# Patient Record
Sex: Female | Born: 1960 | Race: White | Hispanic: No | Marital: Single | Smoking: Current every day smoker
Health system: Southern US, Community
[De-identification: ages and names within clinical notes are randomized; demographics above are authoritative.]

## PROBLEM LIST (undated history)

## (undated) DIAGNOSIS — IMO0002 Reserved for concepts with insufficient information to code with codable children: Secondary | ICD-10-CM

## (undated) DIAGNOSIS — M1990 Unspecified osteoarthritis, unspecified site: Secondary | ICD-10-CM

## (undated) DIAGNOSIS — S3992XA Unspecified injury of lower back, initial encounter: Secondary | ICD-10-CM

## (undated) HISTORY — PX: GASTRIC BYPASS: SHX52

---

## 2013-04-04 ENCOUNTER — Emergency Department (HOSPITAL_COMMUNITY): Payer: Self-pay

## 2013-04-04 ENCOUNTER — Encounter (HOSPITAL_COMMUNITY): Payer: Self-pay | Admitting: Emergency Medicine

## 2013-04-04 ENCOUNTER — Emergency Department (HOSPITAL_COMMUNITY)
Admission: EM | Admit: 2013-04-04 | Discharge: 2013-04-04 | Disposition: A | Discharge status: Home / Self Care | Payer: Self-pay | Attending: Emergency Medicine | Admitting: Emergency Medicine

## 2013-04-04 DIAGNOSIS — R112 Nausea with vomiting, unspecified: Secondary | ICD-10-CM | POA: Insufficient documentation

## 2013-04-04 DIAGNOSIS — R109 Unspecified abdominal pain: Secondary | ICD-10-CM | POA: Insufficient documentation

## 2013-04-04 DIAGNOSIS — R197 Diarrhea, unspecified: Secondary | ICD-10-CM | POA: Insufficient documentation

## 2013-04-04 DIAGNOSIS — F17200 Nicotine dependence, unspecified, uncomplicated: Secondary | ICD-10-CM | POA: Insufficient documentation

## 2013-04-04 DIAGNOSIS — Z79899 Other long term (current) drug therapy: Secondary | ICD-10-CM | POA: Insufficient documentation

## 2013-04-04 DIAGNOSIS — Z8739 Personal history of other diseases of the musculoskeletal system and connective tissue: Secondary | ICD-10-CM | POA: Insufficient documentation

## 2013-04-04 HISTORY — DX: Reserved for concepts with insufficient information to code with codable children: IMO0002

## 2013-04-04 HISTORY — DX: Unspecified injury of lower back, initial encounter: S39.92XA

## 2013-04-04 HISTORY — DX: Unspecified osteoarthritis, unspecified site: M19.90

## 2013-04-04 LAB — URINALYSIS, ROUTINE W REFLEX MICROSCOPIC
BILIRUBIN URINE: NEGATIVE
GLUCOSE, UA: NEGATIVE mg/dL
Hgb urine dipstick: NEGATIVE
Ketones, ur: NEGATIVE mg/dL
Leukocytes, UA: NEGATIVE
Nitrite: NEGATIVE
Protein, ur: NEGATIVE mg/dL
SPECIFIC GRAVITY, URINE: 1.02 (ref 1.005–1.030)
Urobilinogen, UA: 0.2 mg/dL (ref 0.0–1.0)
pH: 7.5 (ref 5.0–8.0)

## 2013-04-04 LAB — CBC WITH DIFFERENTIAL/PLATELET
BASOS PCT: 2 % — AB (ref 0–1)
Basophils Absolute: 0.1 10*3/uL (ref 0.0–0.1)
EOS PCT: 4 % (ref 0–5)
Eosinophils Absolute: 0.2 10*3/uL (ref 0.0–0.7)
HCT: 33.5 % — ABNORMAL LOW (ref 36.0–46.0)
HEMOGLOBIN: 10.2 g/dL — AB (ref 12.0–15.0)
LYMPHS PCT: 23 % (ref 12–46)
Lymphs Abs: 1.3 10*3/uL (ref 0.7–4.0)
MCH: 22.8 pg — AB (ref 26.0–34.0)
MCHC: 30.4 g/dL (ref 30.0–36.0)
MCV: 74.9 fL — ABNORMAL LOW (ref 78.0–100.0)
Monocytes Absolute: 0.5 10*3/uL (ref 0.1–1.0)
Monocytes Relative: 8 % (ref 3–12)
NEUTROS PCT: 63 % (ref 43–77)
Neutro Abs: 3.7 10*3/uL (ref 1.7–7.7)
Platelets: 343 10*3/uL (ref 150–400)
RBC: 4.47 MIL/uL (ref 3.87–5.11)
RDW: 19.1 % — ABNORMAL HIGH (ref 11.5–15.5)
WBC: 5.8 10*3/uL (ref 4.0–10.5)

## 2013-04-04 LAB — COMPREHENSIVE METABOLIC PANEL
ALT: 8 U/L (ref 0–35)
AST: 16 U/L (ref 0–37)
Albumin: 2.8 g/dL — ABNORMAL LOW (ref 3.5–5.2)
Alkaline Phosphatase: 124 U/L — ABNORMAL HIGH (ref 39–117)
BUN: 19 mg/dL (ref 6–23)
CALCIUM: 8.2 mg/dL — AB (ref 8.4–10.5)
CO2: 21 meq/L (ref 19–32)
Chloride: 105 mEq/L (ref 96–112)
Creatinine, Ser: 0.96 mg/dL (ref 0.50–1.10)
GFR calc Af Amer: 77 mL/min — ABNORMAL LOW (ref >90)
GFR, EST NON AFRICAN AMERICAN: 66 mL/min — AB (ref >90)
GLUCOSE: 97 mg/dL (ref 70–99)
POTASSIUM: 4.8 meq/L (ref 3.7–5.3)
SODIUM: 140 meq/L (ref 137–147)
TOTAL PROTEIN: 6.3 g/dL (ref 6.0–8.3)
Total Bilirubin: <0.2 mg/dL — ABNORMAL LOW (ref 0.3–1.2)

## 2013-04-04 LAB — LIPASE, BLOOD: Lipase: 16 U/L (ref 11–59)

## 2013-04-04 MED ORDER — IOHEXOL 300 MG/ML  SOLN: 50.0000 mL | Freq: Once | INTRAMUSCULAR | Status: DC | PRN

## 2013-04-04 MED ORDER — ONDANSETRON 8 MG PO TBDP: 8.0000 mg | ORAL_TABLET | Freq: Three times a day (TID) | ORAL | Status: DC | PRN

## 2013-04-04 MED ORDER — FENTANYL CITRATE 0.05 MG/ML IJ SOLN
50.0000 ug | Freq: Once | INTRAMUSCULAR | Status: AC
  Administered 2013-04-04: 50 ug via INTRAVENOUS
  Filled 2013-04-04: qty 2

## 2013-04-04 MED ORDER — HYDROMORPHONE HCL PF 1 MG/ML IJ SOLN: 0.5000 mg | INTRAMUSCULAR | Status: DC | PRN

## 2013-04-04 MED ORDER — HYDROMORPHONE HCL PF 1 MG/ML IJ SOLN
0.5000 mg | INTRAMUSCULAR | Status: AC | PRN
  Administered 2013-04-04 (×3): 0.5 mg via INTRAVENOUS
  Filled 2013-04-04 (×3): qty 1

## 2013-04-04 MED ORDER — OXYCODONE-ACETAMINOPHEN 5-325 MG PO TABS
1.0000 | ORAL_TABLET | Freq: Once | ORAL | Status: DC
  Filled 2013-04-04: qty 1

## 2013-04-04 MED ORDER — ONDANSETRON HCL 4 MG/2ML IJ SOLN
INTRAMUSCULAR | Status: AC
  Filled 2013-04-04: qty 2

## 2013-04-04 MED ORDER — OXYCODONE-ACETAMINOPHEN 5-325 MG PO TABS: 1.0000 | ORAL_TABLET | ORAL | Status: DC | PRN

## 2013-04-04 MED ORDER — OXYCODONE-ACETAMINOPHEN 5-325 MG PO TABS
1.0000 | ORAL_TABLET | Freq: Once | ORAL | Status: AC
  Administered 2013-04-04: 1 via ORAL

## 2013-04-04 MED ORDER — ONDANSETRON HCL 4 MG/2ML IJ SOLN
4.0000 mg | Freq: Once | INTRAMUSCULAR | Status: AC
  Administered 2013-04-04: 4 mg via INTRAVENOUS
  Filled 2013-04-04: qty 2

## 2013-04-04 NOTE — ED Notes (Signed)
She tells me she has had intermittent abd. "crampy" pain with a few diarrhea stools per day x 3 days "and I just can't stand it anymore".  With much difficulty we have just established IV access, and my colleague is about to medicate her for pain and nausea.

## 2013-04-04 NOTE — ED Notes (Signed)
Patient is aware that we need a urine specimen, will attempt to supply one soon. Patient went to restroom before she was asked for specimen.

## 2013-04-04 NOTE — ED Notes (Signed)
Pt c/o lower abd cramping, n/v/d times two days. Pt states vommited 8-9 times in past 24 hours. Pt states prob 20 loose, bowel movements in past 24 hours.

## 2013-04-04 NOTE — ED Notes (Signed)
Patient transported to CT 

## 2013-04-04 NOTE — ED Provider Notes (Signed)
CSN: [id number]     Arrival date & time 04/04/13  0730 History   First MD Initiated Contact with Patient 04/04/13 [phone]     Chief Complaint  Patient presents with  . Abdominal Pain  . Nausea  . Emesis  . Diarrhea     (Consider location/radiation/quality/duration/timing/severity/associated sxs/prior Treatment) HPI Patient presents emergency department with approximately 3 days of constant lower abdominal pain that at times becomes worse.  She's had nausea and vomiting.  She reports 20-25 watery stools.  No recent antibiotics.  No travel outside the [place].  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  She denies hematemesis.  No melena or hematochezia.  No fevers or chills.  Her pain is mild to moderate in severity at this time.  Nothing worsens or improves her pain.  She states she's never had abdominal pain like this before.  No urinary complaints.  No back pain chest pain or shortness of breath   Past Medical History  Diagnosis Date  . Lower back injury   . Arthritis   . Degenerative disc disease    Past Surgical History  Procedure Laterality Date  . Gastric bypass     No family history on file. History  Substance Use Topics  . Smoking status: Current Every Day Smoker -- 1.00 packs/day    Types: Cigarettes  . Smokeless tobacco: Never Used  . Alcohol Use: No   OB History   Grav Para Term Preterm Abortions TAB SAB Ect Mult Living                 Review of Systems  All other systems reviewed and are negative.      Allergies  Contrast media and Morphine and related  Home Medications   Current Outpatient Rx  Name  Route  Sig  Dispense  Refill  . ondansetron (ZOFRAN) 4 MG tablet   Oral   Take 4 mg by mouth every 8 (eight) hours as needed for nausea or vomiting.         . pantoprazole (PROTONIX) 40 MG tablet   Oral   Take 40 mg by mouth daily.          There were no vitals taken for this visit. Physical Exam  Nursing note and vitals  reviewed. Constitutional: She is oriented to person, place, and time. She appears well-developed and well-nourished. No distress.  HENT:  Head: Normocephalic and atraumatic.  Eyes: EOM are normal.  Neck: Normal range of motion.  Cardiovascular: Normal rate, regular rhythm and normal heart sounds.   Pulmonary/Chest: Effort normal and breath sounds normal.  Abdominal: Soft. She exhibits no distension.  Suprapubic and lower abdominal tenderness without guarding or rebound  Musculoskeletal: Normal range of motion.  Neurological: She is alert and oriented to person, place, and time.  Skin: Skin is warm and dry.  Psychiatric: She has a normal mood and affect. Judgment normal.    ED Course  Procedures (including critical care time) Labs Review Labs Reviewed  CBC WITH DIFFERENTIAL - Abnormal; Notable for the following:    Hemoglobin 10.2 (*)    HCT 33.5 (*)    MCV 74.9 (*)    MCH 22.8 (*)    RDW 19.1 (*)    Basophils Relative 2 (*)    All other components within normal limits  COMPREHENSIVE METABOLIC PANEL - Abnormal; Notable for the following:    Calcium 8.2 (*)    Albumin 2.8 (*)    Alkaline Phosphatase 124 (*)  Total Bilirubin <0.2 (*)    GFR calc non Af Amer 66 (*)    GFR calc Af Amer 77 (*)    All other components within normal limits  LIPASE, BLOOD  URINALYSIS, ROUTINE W REFLEX MICROSCOPIC   Imaging Review Ct Abdomen Pelvis Wo Contrast  04/04/2013   CLINICAL DATA:  Nausea, vomiting, diarrhea.  Contrast allergy.  EXAM: CT ABDOMEN AND PELVIS WITHOUT CONTRAST  TECHNIQUE: Multidetector CT imaging of the abdomen and pelvis was performed following the standard protocol without intravenous contrast.  COMPARISON:  None.  FINDINGS: Visualized lung bases clear. Surgical clips in the gallbladder fossa. Central intrahepatic biliary ductal dilatation and prominence of the common duct up to 6 mm diameter through the pancreatic head. Previous gastric surgery. Unremarkable spleen, adrenal  glands, pancreas, kidneys, abdominal aorta. Stomach is nondistended. There is a mid abdominal small bowel loop associated with anastomotic staple line dilated to 4.9 cm diameter. Remainder small bowel and colon are nondilated. Bilateral pelvic phleboliths. Extraluminal metallic or barium density near the base of the cecum. No ascites. No free air. No adenopathy. T12 compression fracture deformity with at least 30% loss of height anteriorly, minimal retropulsion, no posterior element involvement. Degenerative disc disease L3-4 and L4-5. 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  IMPRESSION: 1. Single dilated loop of mid small bowel associated with a surgical staple line, without additional evidence of small bowel obstruction or ileus. 2. Central intrahepatic and extrahepatic biliary ductal dilatation post cholecystectomy, possibly functional. Correlate with any clinical or laboratory evidence of biliary obstruction. 3. T12 compression fracture deformity, age indeterminate.   Electronically Signed   By: Oley Balm M.D.   On: 04/04/2013 09:48  I personally reviewed the imaging tests through PACS system I reviewed available ER/hospitalization records through the EMR    EKG Interpretation None      MDM   Final diagnoses:  None    Symptomatic treatment.  N.p.o.  Labs, urine, CT scan pending.  We'll continue to manage pain  11:34 AM Patient feels much better at this time.  Abdominal exam is benign.  Patient is tolerating oral fluids in the emergency department.  Patient be discharged home with nausea medicine as well as pain medicine.  She understands to return to the ER for new or worsening symptoms.    Lyanne Co, MD 04/04/13 1134

## 2013-04-06 ENCOUNTER — Emergency Department (HOSPITAL_COMMUNITY): Payer: Self-pay

## 2013-04-06 ENCOUNTER — Emergency Department (HOSPITAL_COMMUNITY)
Admission: EM | Admit: 2013-04-06 | Discharge: 2013-04-06 | Disposition: A | Discharge status: Home / Self Care | Payer: Self-pay | Attending: Emergency Medicine | Admitting: Emergency Medicine

## 2013-04-06 ENCOUNTER — Encounter (HOSPITAL_COMMUNITY): Payer: Self-pay | Admitting: Emergency Medicine

## 2013-04-06 DIAGNOSIS — F17200 Nicotine dependence, unspecified, uncomplicated: Secondary | ICD-10-CM | POA: Insufficient documentation

## 2013-04-06 DIAGNOSIS — Z9884 Bariatric surgery status: Secondary | ICD-10-CM | POA: Insufficient documentation

## 2013-04-06 DIAGNOSIS — K5900 Constipation, unspecified: Secondary | ICD-10-CM | POA: Insufficient documentation

## 2013-04-06 DIAGNOSIS — Z79899 Other long term (current) drug therapy: Secondary | ICD-10-CM | POA: Insufficient documentation

## 2013-04-06 DIAGNOSIS — M129 Arthropathy, unspecified: Secondary | ICD-10-CM | POA: Insufficient documentation

## 2013-04-06 DIAGNOSIS — R109 Unspecified abdominal pain: Secondary | ICD-10-CM | POA: Insufficient documentation

## 2013-04-06 DIAGNOSIS — Z87828 Personal history of other (healed) physical injury and trauma: Secondary | ICD-10-CM | POA: Insufficient documentation

## 2013-04-06 DIAGNOSIS — R112 Nausea with vomiting, unspecified: Secondary | ICD-10-CM | POA: Insufficient documentation

## 2013-04-06 LAB — COMPREHENSIVE METABOLIC PANEL
ALT: 8 U/L (ref 0–35)
AST: 11 U/L (ref 0–37)
Albumin: 2.9 g/dL — ABNORMAL LOW (ref 3.5–5.2)
Alkaline Phosphatase: 132 U/L — ABNORMAL HIGH (ref 39–117)
BUN: 18 mg/dL (ref 6–23)
CHLORIDE: 106 meq/L (ref 96–112)
CO2: 22 meq/L (ref 19–32)
CREATININE: 0.94 mg/dL (ref 0.50–1.10)
Calcium: 8.3 mg/dL — ABNORMAL LOW (ref 8.4–10.5)
GFR, EST AFRICAN AMERICAN: 79 mL/min — AB (ref >90)
GFR, EST NON AFRICAN AMERICAN: 68 mL/min — AB (ref >90)
GLUCOSE: 95 mg/dL (ref 70–99)
Potassium: 4.4 mEq/L (ref 3.7–5.3)
Sodium: 141 mEq/L (ref 137–147)
Total Bilirubin: <0.2 mg/dL — ABNORMAL LOW (ref 0.3–1.2)
Total Protein: 6.7 g/dL (ref 6.0–8.3)

## 2013-04-06 LAB — CBC WITH DIFFERENTIAL/PLATELET
BASOS PCT: 2 % — AB (ref 0–1)
Basophils Absolute: 0.1 10*3/uL (ref 0.0–0.1)
Eosinophils Absolute: 0.2 10*3/uL (ref 0.0–0.7)
Eosinophils Relative: 5 % (ref 0–5)
HEMATOCRIT: 34.5 % — AB (ref 36.0–46.0)
Hemoglobin: 10.6 g/dL — ABNORMAL LOW (ref 12.0–15.0)
Lymphocytes Relative: 19 % (ref 12–46)
Lymphs Abs: 0.9 10*3/uL (ref 0.7–4.0)
MCH: 22.7 pg — AB (ref 26.0–34.0)
MCHC: 30.7 g/dL (ref 30.0–36.0)
MCV: 74 fL — AB (ref 78.0–100.0)
MONOS PCT: 6 % (ref 3–12)
Monocytes Absolute: 0.3 10*3/uL (ref 0.1–1.0)
Neutro Abs: 3.3 10*3/uL (ref 1.7–7.7)
Neutrophils Relative %: 68 % (ref 43–77)
Platelets: 337 10*3/uL (ref 150–400)
RBC: 4.66 MIL/uL (ref 3.87–5.11)
RDW: 19.1 % — ABNORMAL HIGH (ref 11.5–15.5)
WBC: 4.8 10*3/uL (ref 4.0–10.5)

## 2013-04-06 LAB — I-STAT CG4 LACTIC ACID, ED: Lactic Acid, Venous: 0.93 mmol/L (ref 0.5–2.2)

## 2013-04-06 LAB — URINALYSIS, ROUTINE W REFLEX MICROSCOPIC
Bilirubin Urine: NEGATIVE
Glucose, UA: NEGATIVE mg/dL
KETONES UR: NEGATIVE mg/dL
LEUKOCYTES UA: NEGATIVE
Nitrite: NEGATIVE
PROTEIN: NEGATIVE mg/dL
Specific Gravity, Urine: 1.011 (ref 1.005–1.030)
Urobilinogen, UA: 0.2 mg/dL (ref 0.0–1.0)
pH: 5.5 (ref 5.0–8.0)

## 2013-04-06 LAB — URINE MICROSCOPIC-ADD ON

## 2013-04-06 LAB — LIPASE, BLOOD: Lipase: 11 U/L (ref 11–59)

## 2013-04-06 MED ORDER — SODIUM CHLORIDE 0.9 % IV BOLUS (SEPSIS)
1000.0000 mL | Freq: Once | INTRAVENOUS | Status: AC
  Administered 2013-04-06: 1000 mL via INTRAVENOUS

## 2013-04-06 MED ORDER — HYDROMORPHONE HCL PF 1 MG/ML IJ SOLN
1.0000 mg | Freq: Once | INTRAMUSCULAR | Status: AC
  Administered 2013-04-06: 1 mg via INTRAVENOUS
  Filled 2013-04-06: qty 1

## 2013-04-06 MED ORDER — ONDANSETRON HCL 4 MG/2ML IJ SOLN
4.0000 mg | Freq: Once | INTRAMUSCULAR | Status: AC
  Administered 2013-04-06: 4 mg via INTRAVENOUS
  Filled 2013-04-06: qty 2

## 2013-04-06 MED ORDER — ONDANSETRON HCL 4 MG PO TABS: 4.0000 mg | ORAL_TABLET | Freq: Four times a day (QID) | ORAL | Status: DC

## 2013-04-06 MED ORDER — OXYCODONE-ACETAMINOPHEN 5-325 MG PO TABS: 2.0000 | ORAL_TABLET | ORAL | Status: DC | PRN

## 2013-04-06 NOTE — ED Notes (Signed)
Pt c/o low abd pain x 2 months, worsening over the last 6 days.  C/o constipation x 4 days.  States that she was having diarrhea prior to that.  Was told that she needed to see a surgeon for a "kink in her intestine".

## 2013-04-06 NOTE — Discharge Instructions (Signed)
Abdominal Pain, Adult Follow up with your doctor and the GI doctor as needed. Return to the ED if you develop new or worsening symptoms. Many things can cause abdominal pain. Usually, abdominal pain is not caused by a disease and will improve without treatment. It can often be observed and treated at home. Your health care provider will do a physical exam and possibly order blood tests and X-rays to help determine the seriousness of your pain. However, in many cases, more time must pass before a clear cause of the pain can be found. Before that point, your health care provider may not know if you need more testing or further treatment. HOME CARE INSTRUCTIONS  Monitor your abdominal pain for any changes. The following actions may help to alleviate any discomfort you are experiencing:  Only take over-the-counter or prescription medicines as directed by your health care provider.  Do not take laxatives unless directed to do so by your health care provider.  Try a clear liquid diet (broth, tea, or water) as directed by your health care provider. Slowly move to a bland diet as tolerated. SEEK MEDICAL CARE IF:  You have unexplained abdominal pain.  You have abdominal pain associated with nausea or diarrhea.  You have pain when you urinate or have a bowel movement.  You experience abdominal pain that wakes you in the night.  You have abdominal pain that is worsened or improved by eating food.  You have abdominal pain that is worsened with eating fatty foods. SEEK IMMEDIATE MEDICAL CARE IF:   Your pain does not go away within 2 hours.  You have a fever.  You keep throwing up (vomiting).  Your pain is felt only in portions of the abdomen, such as the right side or the left lower portion of the abdomen.  You pass bloody or black tarry stools. MAKE SURE YOU:  Understand these instructions.   Will watch your condition.   Will get help right away if you are not doing well or get worse.   Document Released: 10/31/2004 Document Revised: 11/11/2012 Document Reviewed: 09/30/2012 [facility] Patient Information 2014 [place], [place].

## 2013-04-06 NOTE — Progress Notes (Signed)
[facility] provided pt with a list of primary care resources. Patient stated that she was pending insurance.  °

## 2013-04-06 NOTE — ED Provider Notes (Signed)
CSN: [id number]     Arrival date & time 04/06/13  0708 History   First MD Initiated Contact with Patient 04/06/13 [phone]     Chief Complaint  Patient presents with  . Abdominal Pain  . Constipation     (Consider location/radiation/quality/duration/timing/severity/associated sxs/prior Treatment) HPI Comments: Lower abdominal pain that has been constant for the past 3 days. Has had pain on and off for months but constant in the past several days.  Seen 2 days ago and had CT that showed dilated bowel loop.  Patient has continued nausea, vomiting, and now constipation.  Was having diarrhea prior to that.  No BM in 3 days.  No fever. No urinary or vaginal symptoms. Hx gastric bypass, last surgery in 2003.  No back pain.  No blood in stool. Nothing makes the pain better, palpation makes it worse.  The history is provided by the patient.    Past Medical History  Diagnosis Date  . Lower back injury   . Arthritis   . Degenerative disc disease    Past Surgical History  Procedure Laterality Date  . Gastric bypass     No family history on file. History  Substance Use Topics  . Smoking status: Current Every Day Smoker -- 1.00 packs/day    Types: Cigarettes  . Smokeless tobacco: Never Used  . Alcohol Use: No   OB History   Grav Para Term Preterm Abortions TAB SAB Ect Mult Living                 Review of Systems  Constitutional: Positive for activity change and appetite change. Negative for fever and fatigue.  HENT: Negative for congestion and rhinorrhea.   Respiratory: Negative for cough, chest tightness and shortness of breath.   Gastrointestinal: Positive for nausea, abdominal pain and constipation. Negative for vomiting.  Genitourinary: Negative for dysuria, hematuria, vaginal bleeding and vaginal discharge.  Musculoskeletal: Negative for arthralgias, back pain and myalgias.  Neurological: Negative for dizziness, weakness and headaches.  A complete 10 system review of systems was  obtained and all systems are negative except as noted in the HPI and PMH.      Allergies  Contrast media and Morphine and related  Home Medications   Current Outpatient Rx  Name  Route  Sig  Dispense  Refill  . ondansetron (ZOFRAN ODT) 8 MG disintegrating tablet   Oral   Take 1 tablet (8 mg total) by mouth every 8 (eight) hours as needed for nausea or vomiting.   12 tablet   0   . ondansetron (ZOFRAN) 4 MG tablet   Oral   Take 4 mg by mouth every 8 (eight) hours as needed for nausea or vomiting.         Marland Kitchen. oxyCODONE-acetaminophen (PERCOCET/ROXICET) 5-325 MG per tablet   Oral   Take 1 tablet by mouth every 4 (four) hours as needed for severe pain.   20 tablet   0   . pantoprazole (PROTONIX) 40 MG tablet   Oral   Take 40 mg by mouth daily.         . ondansetron (ZOFRAN) 4 MG tablet   Oral   Take 1 tablet (4 mg total) by mouth every 6 (six) hours.   12 tablet   0   . oxyCODONE-acetaminophen (PERCOCET/ROXICET) 5-325 MG per tablet   Oral   Take 2 tablets by mouth every 4 (four) hours as needed for severe pain.   15 tablet   0  BP 172/94  Pulse 67  Temp(Src) 98.3 F (36.8 C) (Oral)  Resp 20  SpO2 97% Physical Exam  Constitutional: She is oriented to person, place, and time. She appears well-developed and well-nourished. No distress.  HENT:  Head: Normocephalic and atraumatic.  Mouth/Throat: Oropharynx is clear and moist. No oropharyngeal exudate.  Eyes: Conjunctivae and EOM are normal. Pupils are equal, round, and reactive to light.  Neck: Normal range of motion. Neck supple.  Cardiovascular: Normal rate, regular rhythm and normal heart sounds.   Pulmonary/Chest: Effort normal and breath sounds normal. No respiratory distress.  Abdominal: Soft. There is tenderness. There is guarding. There is no rebound.  Lower abdominal tenderness with guarding, worse in suprapubic area  Genitourinary:  No fecal impaction  Musculoskeletal: Normal range of motion. She  exhibits no edema and no tenderness.  Neurological: She is alert and oriented to person, place, and time. No cranial nerve deficit. She exhibits normal muscle tone. Coordination normal.  Skin: Skin is warm.    ED Course  Procedures (including critical care time) Labs Review Labs Reviewed  CBC WITH DIFFERENTIAL - Abnormal; Notable for the following:    Hemoglobin 10.6 (*)    HCT 34.5 (*)    MCV 74.0 (*)    MCH 22.7 (*)    RDW 19.1 (*)    Basophils Relative 2 (*)    All other components within normal limits  COMPREHENSIVE METABOLIC PANEL - Abnormal; Notable for the following:    Calcium 8.3 (*)    Albumin 2.9 (*)    Alkaline Phosphatase 132 (*)    Total Bilirubin <0.2 (*)    GFR calc non Af Amer 68 (*)    GFR calc Af Amer 79 (*)    All other components within normal limits  URINALYSIS, ROUTINE W REFLEX MICROSCOPIC - Abnormal; Notable for the following:    Hgb urine dipstick SMALL (*)    All other components within normal limits  LIPASE, BLOOD  URINE MICROSCOPIC-ADD ON  I-STAT CG4 LACTIC ACID, ED  POC OCCULT BLOOD, ED   Imaging Review Ct Abdomen Pelvis Wo Contrast  04/06/2013   CLINICAL DATA:  Lower abdominal pain for the past 2 months. Constipation.  EXAM: CT ABDOMEN AND PELVIS WITHOUT CONTRAST  TECHNIQUE: Multidetector CT imaging of the abdomen and pelvis was performed following the standard protocol without intravenous contrast.  COMPARISON:  CT of the abdomen and pelvis 04/04/2013.  FINDINGS: Lung Bases: Unremarkable.  Abdomen/Pelvis: Postoperative changes of gastric bypass procedure are noted. Anastomotic suture line in the right lower quadrant associated with multiple small bowel loops (previously noted bowel dilatation in this region has resolved). No significant volume of ascites. No pneumoperitoneum. No pathologic distention of small bowel. No definite lymphadenopathy identified within the abdomen or pelvis on today's non contrast CT examination.  Status post cholecystectomy.  Mild intrahepatic biliary ductal dilatation is similar to the prior study. Otherwise, the unenhanced appearance of liver is unremarkable. The unenhanced appearance of the pancreas, spleen, bilateral adrenal glands and bilateral kidneys is unremarkable. Status post hysterectomy. Ovaries are not confidently identified may be surgically absent or atrophic. Urinary bladder is unremarkable in appearance.  Musculoskeletal: Unchanged compression fracture at T12 with approximately 40% loss of anterior vertebral body height. There are no aggressive appearing lytic or blastic lesions noted in the visualized portions of the skeleton.  IMPRESSION: 1. No acute findings in the abdomen or pelvis to account for the patient's symptoms. 2. Extensive postoperative changes redemonstrated, as above. 3. Mild intrahepatic biliary  ductal dilatation appears unchanged. Although this may simply be chronic in this patient status post cholecystectomy, as previously suggested, clinical correlation for signs and symptoms of biliary tract obstruction is suggested. 4. Old compression fracture of T12 with approximately 40% loss of anterior vertebral body height is unchanged.   Electronically Signed   By: Trudie Reed M.D.   On: 04/06/2013 11:15   Dg Abd Acute W/chest  04/06/2013   CLINICAL DATA:  ABDOMINAL PAIN CONSTIPATION  EXAM: ACUTE ABDOMEN SERIES (ABDOMEN 2 VIEW & CHEST 1 VIEW)  COMPARISON:  CT ABD/PELV WO CM dated 04/04/2013  FINDINGS: Normal cardiac silhouette. Lungs are clear. No air beneath the hemidiaphragm. Surgical clips in the region of the stomach. There are no dilated loops of large or small bowel. No pathologic calcifications. No organomegaly. There is gas stool rectum. Surgical clips in the right abdomen additionally  IMPRESSION: 1.  No acute cardiopulmonary process. 2. No bowel obstruction or free air. .   Electronically Signed   By: Genevive Bi M.D.   On: 04/06/2013 08:19     EKG Interpretation None      MDM    Final diagnoses:  Abdominal pain  Abdominal pain with constipation, vomiting.  CT 2 days ago with dilated bowel loops.  Worsening pain, concern for obstruction.  Labs appear to be at baseline.  Hemoglobin and Creatinine stable.  CT scan shows resolution of previous dilated bowel loop. Stable biliary duct dilation. LFTs and lipase normal. No upper abdominal pain.  Patient's pain is improved in the ED. She's tolerating by mouth and has not had any vomiting or stooling. Patient referred to gastroenterology. Her Zofran will be refilled.  BP 172/94  Pulse 67  Temp(Src) 98.3 F (36.8 C) (Oral)  Resp 20  SpO2 97%   Glynn Octave, MD 04/06/13 1557

## 2013-04-08 ENCOUNTER — Emergency Department (HOSPITAL_COMMUNITY)
Admission: EM | Admit: 2013-04-08 | Discharge: 2013-04-08 | Disposition: A | Discharge status: Home / Self Care | Payer: Self-pay | Attending: Emergency Medicine | Admitting: Emergency Medicine

## 2013-04-08 ENCOUNTER — Encounter (HOSPITAL_COMMUNITY): Payer: Self-pay | Admitting: Emergency Medicine

## 2013-04-08 ENCOUNTER — Emergency Department (HOSPITAL_COMMUNITY): Payer: Self-pay

## 2013-04-08 DIAGNOSIS — Z9884 Bariatric surgery status: Secondary | ICD-10-CM | POA: Insufficient documentation

## 2013-04-08 DIAGNOSIS — Z8739 Personal history of other diseases of the musculoskeletal system and connective tissue: Secondary | ICD-10-CM | POA: Insufficient documentation

## 2013-04-08 DIAGNOSIS — Z87828 Personal history of other (healed) physical injury and trauma: Secondary | ICD-10-CM | POA: Insufficient documentation

## 2013-04-08 DIAGNOSIS — R109 Unspecified abdominal pain: Secondary | ICD-10-CM | POA: Insufficient documentation

## 2013-04-08 DIAGNOSIS — Z9889 Other specified postprocedural states: Secondary | ICD-10-CM | POA: Insufficient documentation

## 2013-04-08 DIAGNOSIS — F17200 Nicotine dependence, unspecified, uncomplicated: Secondary | ICD-10-CM | POA: Insufficient documentation

## 2013-04-08 DIAGNOSIS — R197 Diarrhea, unspecified: Secondary | ICD-10-CM | POA: Insufficient documentation

## 2013-04-08 DIAGNOSIS — R112 Nausea with vomiting, unspecified: Secondary | ICD-10-CM | POA: Insufficient documentation

## 2013-04-08 LAB — I-STAT CHEM 8, ED
BUN: 9 mg/dL (ref 6–23)
CALCIUM ION: 1.13 mmol/L (ref 1.12–1.23)
Chloride: 106 mEq/L (ref 96–112)
Creatinine, Ser: 0.8 mg/dL (ref 0.50–1.10)
Glucose, Bld: 91 mg/dL (ref 70–99)
HEMATOCRIT: 37 % (ref 36.0–46.0)
HEMOGLOBIN: 12.6 g/dL (ref 12.0–15.0)
POTASSIUM: 4 meq/L (ref 3.7–5.3)
Sodium: 143 mEq/L (ref 137–147)
TCO2: 24 mmol/L (ref 0–100)

## 2013-04-08 LAB — I-STAT TROPONIN, ED: Troponin i, poc: 0 ng/mL (ref 0.00–0.08)

## 2013-04-08 MED ORDER — HYDROMORPHONE HCL PF 1 MG/ML IJ SOLN
1.0000 mg | Freq: Once | INTRAMUSCULAR | Status: AC
  Administered 2013-04-08: 1 mg via INTRAVENOUS
  Filled 2013-04-08: qty 1

## 2013-04-08 MED ORDER — ONDANSETRON HCL 4 MG/2ML IJ SOLN
4.0000 mg | Freq: Once | INTRAMUSCULAR | Status: AC
  Administered 2013-04-08: 4 mg via INTRAVENOUS
  Filled 2013-04-08: qty 2

## 2013-04-08 MED ORDER — OXYCODONE-ACETAMINOPHEN 10-650 MG PO TABS
1.0000 | ORAL_TABLET | Freq: Four times a day (QID) | ORAL | Status: DC | PRN
Start: 2013-04-08 — End: 2013-11-10

## 2013-04-08 MED ORDER — SODIUM CHLORIDE 0.9 % IV SOLN: INTRAVENOUS | Status: DC

## 2013-04-08 MED ORDER — SODIUM CHLORIDE 0.9 % IV BOLUS (SEPSIS)
1000.0000 mL | Freq: Once | INTRAVENOUS | Status: AC
  Administered 2013-04-08: 1000 mL via INTRAVENOUS

## 2013-04-08 NOTE — ED Provider Notes (Signed)
Medical screening examination/treatment/procedure(s) were performed by non-physician practitioner and as supervising physician I was immediately available for consultation/collaboration.   EKG Interpretation None       Shon Batonourtney F Horton, MD 04/08/13 1622

## 2013-04-08 NOTE — Discharge Instructions (Signed)
Abdominal Pain, Women °Abdominal (stomach, pelvic, or belly) pain can be caused by many things. It is important to tell your doctor: °· The location of the pain. °· Does it come and go or is it present all the time? °· Are there things that start the pain (eating certain foods, exercise)? °· Are there other symptoms associated with the pain (fever, nausea, vomiting, diarrhea)? °All of this is helpful to know when trying to find the cause of the pain. °CAUSES  °· Stomach: virus or bacteria infection, or ulcer. °· Intestine: appendicitis (inflamed appendix), regional ileitis (Crohn's disease), ulcerative colitis (inflamed colon), irritable bowel syndrome, diverticulitis (inflamed diverticulum of the colon), or cancer of the stomach or intestine. °· Gallbladder disease or stones in the gallbladder. °· Kidney disease, kidney stones, or infection. °· Pancreas infection or cancer. °· Fibromyalgia (pain disorder). °· Diseases of the female organs: °· Uterus: fibroid (non-cancerous) tumors or infection. °· Fallopian tubes: infection or tubal pregnancy. °· Ovary: cysts or tumors. °· Pelvic adhesions (scar tissue). °· Endometriosis (uterus lining tissue growing in the pelvis and on the pelvic organs). °· Pelvic congestion syndrome (female organs filling up with blood just before the menstrual period). °· Pain with the menstrual period. °· Pain with ovulation (producing an egg). °· Pain with an IUD (intrauterine device, birth control) in the uterus. °· Cancer of the female organs. °· Functional pain (pain not caused by a disease, may improve without treatment). °· Psychological pain. °· Depression. °DIAGNOSIS  °Your doctor will decide the seriousness of your pain by doing an examination. °· Blood tests. °· X-rays. °· Ultrasound. °· CT scan (computed tomography, special type of X-ray). °· MRI (magnetic resonance imaging). °· Cultures, for infection. °· Barium enema (dye inserted in the large intestine, to better view it with  X-rays). °· Colonoscopy (looking in intestine with a lighted tube). °· Laparoscopy (minor surgery, looking in abdomen with a lighted tube). °· Major abdominal exploratory surgery (looking in abdomen with a large incision). °TREATMENT  °The treatment will depend on the cause of the pain.  °· Many cases can be observed and treated at home. °· Over-the-counter medicines recommended by your caregiver. °· Prescription medicine. °· Antibiotics, for infection. °· Birth control pills, for painful periods or for ovulation pain. °· Hormone treatment, for endometriosis. °· Nerve blocking injections. °· Physical therapy. °· Antidepressants. °· Counseling with a psychologist or psychiatrist. °· Minor or major surgery. °HOME CARE INSTRUCTIONS  °· Do not take laxatives, unless directed by your caregiver. °· Take over-the-counter pain medicine only if ordered by your caregiver. Do not take aspirin because it can cause an upset stomach or bleeding. °· Try a clear liquid diet (broth or water) as ordered by your caregiver. Slowly move to a bland diet, as tolerated, if the pain is related to the stomach or intestine. °· Have a thermometer and take your temperature several times a day, and record it. °· Bed rest and sleep, if it helps the pain. °· Avoid sexual intercourse, if it causes pain. °· Avoid stressful situations. °· Keep your follow-up appointments and tests, as your caregiver orders. °· If the pain does not go away with medicine or surgery, you may try: °· Acupuncture. °· Relaxation exercises (yoga, meditation). °· Group therapy. °· Counseling. °SEEK MEDICAL CARE IF:  °· You notice certain foods cause stomach pain. °· Your home care treatment is not helping your pain. °· You need stronger pain medicine. °· You want your IUD removed. °· You feel faint or   lightheaded. °· You develop nausea and vomiting. °· You develop a rash. °· You are having side effects or an allergy to your medicine. °SEEK IMMEDIATE MEDICAL CARE IF:  °· Your  pain does not go away or gets worse. °· You have a fever. °· Your pain is felt only in portions of the abdomen. The right side could possibly be appendicitis. The left lower portion of the abdomen could be colitis or diverticulitis. °· You are passing blood in your stools (bright red or black tarry stools, with or without vomiting). °· You have blood in your urine. °· You develop chills, with or without a fever. °· You pass out. °MAKE SURE YOU:  °· Understand these instructions. °· Will watch your condition. °· Will get help right away if you are not doing well or get worse. °Document Released: 11/18/2006 Document Revised: 04/15/2011 Document Reviewed: 12/08/2008 °[employer]® Patient Information ©2014 [employer]. ° °

## 2013-04-08 NOTE — ED Notes (Signed)
Patient transported to X-ray 

## 2013-04-08 NOTE — ED Notes (Signed)
Pt presents with lower abd pain, nausea, vomiting, and diarrhea starting yesterday

## 2013-04-08 NOTE — ED Notes (Signed)
Pt instructed to follow up with gastro. Pt aware.

## 2013-04-08 NOTE — ED Provider Notes (Addendum)
CSN: [id number]     Arrival date & time 04/08/13  0828 History   First MD Initiated Contact with Patient 04/08/13 1003     Chief Complaint  Patient presents with  . Abdominal Pain     (Consider location/radiation/quality/duration/timing/severity/associated sxs/prior Treatment) HPI  Patient presents to the emergency department for evaluation for lower abdominal pain, nausea, vomiting and diarrhea. She has been seen 2 times for this in the past week. She was seen in the ER on March 1 as well as March 3 each time she had a CT scan of her abdomen which showed no acute abnormalities to explain patient's symptoms. The patient says that this started again yesterday.  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 The past four months have been very challenging with lots of episodes of nausea, vomiting, diarrhea and diffuse abdominal pain. Patient is convinced that something is wrong with her surgery. She went to [facility] and had the scans done. She says the pain medications helps her to feel better and lasts about 6 hours, then the percocets she has been written also helps but as soon as she runs out of the medicine she is right back to hurting severely. She ran out of her last dose of percocet at 7am this morning. Her aunt told her to come back  To the ER because she didn't want her crying in bed until March 16 to when her medicaid kicks in and she can see a specialist. She understands that we may not be able to figure out what is wrong but needs help with her pain and a referral to a SolicitorGastroenterologist.  Past Medical History  Diagnosis Date  . Lower back injury   . Arthritis   . Degenerative disc disease    Past Surgical History  Procedure Laterality Date  . Gastric bypass     History reviewed. No pertinent family history. History  Substance Use Topics  . Smoking status: Current Every Day Smoker -- 1.00 packs/day    Types:  Cigarettes  . Smokeless tobacco: Never Used  . Alcohol Use: No   OB History   Grav Para Term Preterm Abortions TAB SAB Ect Mult Living                 Review of Systems   The patient denies anorexia, fever, weight loss,, vision loss, decreased hearing, hoarseness, chest pain, syncope, dyspnea on exertion, peripheral edema, balance deficits, hemoptysis,  melena, hematochezia, severe indigestion/heartburn, hematuria, incontinence, genital sores, muscle weakness, suspicious skin lesions, transient blindness, difficulty walking, depression, unusual weight change, abnormal bleeding, enlarged lymph nodes, angioedema, and breast masses.    Allergies  Contrast media; Morphine and related; and Vicodin  Home Medications   Current Outpatient Rx  Name  Route  Sig  Dispense  Refill  . ondansetron (ZOFRAN ODT) 8 MG disintegrating tablet   Oral   Take 1 tablet (8 mg total) by mouth every 8 (eight) hours as needed for nausea or vomiting.   12 tablet   0   . oxyCODONE-acetaminophen (PERCOCET/ROXICET) 5-325 MG per tablet   Oral   Take 2 tablets by mouth every 4 (four) hours as needed for severe pain.   15 tablet   0   . oxyCODONE-acetaminophen (PERCOCET) 10-650 MG per tablet   Oral   Take 1 tablet by mouth every 6 (six) hours as needed for pain.   20 tablet   0  BP 153/88  Pulse 61  Temp(Src) 98.1 F (36.7 C) (Oral)  Resp 24  SpO2 100% Physical Exam  Nursing note and vitals reviewed. Constitutional: She appears well-developed and well-nourished. No distress.  HENT:  Head: Normocephalic and atraumatic.  Eyes: Pupils are equal, round, and reactive to light.  Neck: Normal range of motion. Neck supple.  Cardiovascular: Normal rate and regular rhythm.   Pulmonary/Chest: Effort normal.  Abdominal: Soft. There is tenderness (mild diffuse). There is no rebound, no guarding and no CVA tenderness.  Neurological: She is alert.  Skin: Skin is warm and dry.    ED Course  Procedures  (including critical care time) Labs Review Labs Reviewed  STOOL CULTURE  I-STAT CHEM 8, ED  I-STAT TROPOININ, ED   Imaging Review Dg Abd Acute W/chest  04/08/2013   CLINICAL DATA:  Abdominal pain with nausea and vomiting  EXAM: ACUTE ABDOMEN SERIES (ABDOMEN 2 VIEW & CHEST 1 VIEW)  COMPARISON:  April 06, 2013  FINDINGS: PA chest: Lungs are clear. Heart size and pulmonary vascularity are normal. No adenopathy. No bone lesions.  Supine and upright abdomen: There is moderate stool throughout the colon. There is no appreciable bowel dilatation or air-fluid level to suggest obstruction. No free air. There are multiple surgical clips in the abdomen. There is a phlebolith in the pelvis. There is lumbar levoscoliosis.  IMPRESSION: No bowel obstruction or free air. Multiple surgical clips present. No lung edema or consolidation.   Electronically Signed   By: Bretta Bang M.D.   On: 04/08/2013 10:45     EKG Interpretation None      MDM   Final diagnoses:  Abdominal pain    labd an xrays unremarkable. Pt notified that she can not continue to come ot the ER for this and will have to follow-up with the referred GI doctor. She says her medicaid starts on March 16 and she plans to have an appointment that today. Reports she will  Make the medicine I gave her last.   53 y.o.Kim Allison's evaluation in the Emergency Department is complete. It has been determined that no acute conditions requiring further emergency intervention are present at this time. The patient/guardian have been advised of the diagnosis and plan. We have discussed signs and symptoms that warrant return to the ED, such as changes or worsening in symptoms.  Vital signs are stable at discharge. Filed Vitals:   04/08/13 1308  BP:   Pulse: 61  Temp:   Resp:     Patient/guardian has voiced understanding and agreed to follow-up with the PCP or specialist.     Dorthula Matas, PA-C 04/08/13 1312  Dorthula Matas,  PA-C 04/18/13 1423

## 2013-04-19 NOTE — ED Provider Notes (Signed)
Medical screening examination/treatment/procedure(s) were performed by non-physician practitioner and as supervising physician I was immediately available for consultation/collaboration.   EKG Interpretation None       Shon Batonourtney F Horton, MD 04/19/13 [phone]

## 2013-11-10 ENCOUNTER — Emergency Department (HOSPITAL_COMMUNITY)
Admission: EM | Admit: 2013-11-10 | Discharge: 2013-11-10 | Disposition: A | Discharge status: Home / Self Care | Payer: Self-pay | Attending: Emergency Medicine | Admitting: Emergency Medicine

## 2013-11-10 ENCOUNTER — Emergency Department (HOSPITAL_COMMUNITY): Payer: Self-pay

## 2013-11-10 ENCOUNTER — Encounter (HOSPITAL_COMMUNITY): Payer: Self-pay | Admitting: Emergency Medicine

## 2013-11-10 DIAGNOSIS — R109 Unspecified abdominal pain: Secondary | ICD-10-CM

## 2013-11-10 DIAGNOSIS — R1030 Lower abdominal pain, unspecified: Secondary | ICD-10-CM | POA: Insufficient documentation

## 2013-11-10 DIAGNOSIS — Z87828 Personal history of other (healed) physical injury and trauma: Secondary | ICD-10-CM | POA: Insufficient documentation

## 2013-11-10 DIAGNOSIS — Z8739 Personal history of other diseases of the musculoskeletal system and connective tissue: Secondary | ICD-10-CM | POA: Insufficient documentation

## 2013-11-10 DIAGNOSIS — Z720 Tobacco use: Secondary | ICD-10-CM | POA: Insufficient documentation

## 2013-11-10 LAB — CBC WITH DIFFERENTIAL/PLATELET
Basophils Absolute: 0.1 10*3/uL (ref 0.0–0.1)
Basophils Relative: 1 % (ref 0–1)
EOS ABS: 0.2 10*3/uL (ref 0.0–0.7)
EOS PCT: 3 % (ref 0–5)
HCT: 32.8 % — ABNORMAL LOW (ref 36.0–46.0)
Hemoglobin: 10.2 g/dL — ABNORMAL LOW (ref 12.0–15.0)
Lymphocytes Relative: 35 % (ref 12–46)
Lymphs Abs: 1.8 10*3/uL (ref 0.7–4.0)
MCH: 23.6 pg — ABNORMAL LOW (ref 26.0–34.0)
MCHC: 31.1 g/dL (ref 30.0–36.0)
MCV: 75.8 fL — AB (ref 78.0–100.0)
Monocytes Absolute: 0.6 10*3/uL (ref 0.1–1.0)
Monocytes Relative: 11 % (ref 3–12)
Neutro Abs: 2.5 10*3/uL (ref 1.7–7.7)
Neutrophils Relative %: 50 % (ref 43–77)
Platelets: 376 10*3/uL (ref 150–400)
RBC: 4.33 MIL/uL (ref 3.87–5.11)
RDW: 19.1 % — AB (ref 11.5–15.5)
WBC: 5.1 10*3/uL (ref 4.0–10.5)

## 2013-11-10 LAB — COMPREHENSIVE METABOLIC PANEL
ALBUMIN: 2.9 g/dL — AB (ref 3.5–5.2)
ALT: 6 U/L (ref 0–35)
AST: 11 U/L (ref 0–37)
Alkaline Phosphatase: 82 U/L (ref 39–117)
Anion gap: 12 (ref 5–15)
BUN: 17 mg/dL (ref 6–23)
CO2: 23 mEq/L (ref 19–32)
Calcium: 8.6 mg/dL (ref 8.4–10.5)
Chloride: 103 mEq/L (ref 96–112)
Creatinine, Ser: 0.81 mg/dL (ref 0.50–1.10)
GFR calc Af Amer: >90 mL/min (ref >90)
GFR calc non Af Amer: 81 mL/min — ABNORMAL LOW (ref >90)
Glucose, Bld: 91 mg/dL (ref 70–99)
Potassium: 4.1 mEq/L (ref 3.7–5.3)
Sodium: 138 mEq/L (ref 137–147)
Total Bilirubin: 0.3 mg/dL (ref 0.3–1.2)
Total Protein: 6.9 g/dL (ref 6.0–8.3)

## 2013-11-10 LAB — URINALYSIS, ROUTINE W REFLEX MICROSCOPIC
Bilirubin Urine: NEGATIVE
GLUCOSE, UA: NEGATIVE mg/dL
Hgb urine dipstick: NEGATIVE
Ketones, ur: NEGATIVE mg/dL
LEUKOCYTES UA: NEGATIVE
Nitrite: NEGATIVE
Protein, ur: NEGATIVE mg/dL
Specific Gravity, Urine: 1.015 (ref 1.005–1.030)
Urobilinogen, UA: 0.2 mg/dL (ref 0.0–1.0)
pH: 8 (ref 5.0–8.0)

## 2013-11-10 LAB — LIPASE, BLOOD: Lipase: 19 U/L (ref 11–59)

## 2013-11-10 MED ORDER — SODIUM CHLORIDE 0.9 % IV SOLN: 1000.0000 mL | INTRAVENOUS | Status: DC

## 2013-11-10 MED ORDER — SODIUM CHLORIDE 0.9 % IV SOLN
1000.0000 mL | Freq: Once | INTRAVENOUS | Status: AC
  Administered 2013-11-10: 1000 mL via INTRAVENOUS

## 2013-11-10 MED ORDER — HYDROMORPHONE HCL 1 MG/ML IJ SOLN
1.0000 mg | Freq: Once | INTRAMUSCULAR | Status: AC
  Administered 2013-11-10: 1 mg via INTRAVENOUS
  Filled 2013-11-10: qty 1

## 2013-11-10 MED ORDER — ONDANSETRON HCL 4 MG/2ML IJ SOLN
4.0000 mg | Freq: Once | INTRAMUSCULAR | Status: AC
  Administered 2013-11-10: 4 mg via INTRAVENOUS
  Filled 2013-11-10: qty 2

## 2013-11-10 MED ORDER — OXYCODONE-ACETAMINOPHEN 5-325 MG PO TABS: 1.0000 | ORAL_TABLET | ORAL | Status: AC | PRN

## 2013-11-10 MED ORDER — ONDANSETRON 8 MG PO TBDP: 8.0000 mg | ORAL_TABLET | Freq: Three times a day (TID) | ORAL | Status: AC | PRN

## 2013-11-10 NOTE — ED Notes (Signed)
Pt alert and oriented x4. Respirations even and unlabored, bilateral symmetrical rise and fall of chest. Skin warm and dry. In no acute distress. Denies needs.   

## 2013-11-10 NOTE — ED Notes (Signed)
Pt reports lower abd pain x2 days. N/Vx6/Dx16 in 24 hours. Decreased appetite.

## 2013-11-10 NOTE — Discharge Instructions (Signed)

## 2013-11-10 NOTE — ED Provider Notes (Signed)
CSN: [id number]     Arrival date & time 11/10/13  1718 History   First MD Initiated Contact with Patient 11/10/13 1851     Chief Complaint  Patient presents with  . Abdominal Pain      HPI Patient ports history of gastric bypass.  She presents with severe lower abdominal pain worsening over the past 3 days with associated diarrhea and nausea.  No vomiting.  No significant abdominal distention.  No fevers or chills.  Mild dysuria.  No urinary frequency.  No flank pain.  She's had pain like this one other time in March of this year.  At that time she was seen in the ER a CT scan was performed which demonstrated questionable loop of small bowel near prior surgical site.   Past Medical History  Diagnosis Date  . Lower back injury   . Arthritis   . Degenerative disc disease    Past Surgical History  Procedure Laterality Date  . Gastric bypass     No family history on file. History  Substance Use Topics  . Smoking status: Current Every Day Smoker -- 1.00 packs/day    Types: Cigarettes  . Smokeless tobacco: Never Used  . Alcohol Use: No   OB History   Grav Para Term Preterm Abortions TAB SAB Ect Mult Living                 Review of Systems  All other systems reviewed and are negative.     Allergies  B12-c-des gastric sub-fe; Contrast media; Morphine and related; and Vicodin  Home Medications   Prior to Admission medications   Not on File   BP 150/99  Pulse 86  Temp(Src) 98.7 F (37.1 C) (Oral)  Resp 16  SpO2 100% Physical Exam  Nursing note and vitals reviewed. Constitutional: She is oriented to person, place, and time. She appears well-developed and well-nourished. No distress.  HENT:  Head: Normocephalic and atraumatic.  Eyes: EOM are normal.  Neck: Normal range of motion.  Cardiovascular: Normal rate, regular rhythm and normal heart sounds.   Pulmonary/Chest: Effort normal and breath sounds normal.  Abdominal: Soft. She exhibits no distension.  Lower  abdominal tenderness without guarding or rebound  Musculoskeletal: Normal range of motion.  Neurological: She is alert and oriented to person, place, and time.  Skin: Skin is warm and dry.  Psychiatric: She has a normal mood and affect. Judgment normal.    ED Course  Procedures (including critical care time) Labs Review Labs Reviewed  CBC WITH DIFFERENTIAL - Abnormal; Notable for the following:    Hemoglobin 10.2 (*)    HCT 32.8 (*)    MCV 75.8 (*)    MCH 23.6 (*)    RDW 19.1 (*)    All other components within normal limits  COMPREHENSIVE METABOLIC PANEL - Abnormal; Notable for the following:    Albumin 2.9 (*)    GFR calc non Af Amer 81 (*)    All other components within normal limits  URINALYSIS, ROUTINE W REFLEX MICROSCOPIC - Abnormal; Notable for the following:    APPearance CLOUDY (*)    All other components within normal limits  LIPASE, BLOOD    Imaging Review Ct Abdomen Pelvis Wo Contrast  11/10/2013   CLINICAL DATA:  Diffuse pelvic pain below the umbilicus as started approximately 3 days ago at home. Diarrhea. No vomiting.  EXAM: CT ABDOMEN AND PELVIS WITHOUT CONTRAST  TECHNIQUE: Multidetector CT imaging of the abdomen and pelvis was performed  following the standard protocol without IV contrast.  COMPARISON:  CT abdomen and pelvis - 04/04/2013  FINDINGS: The lack of intravenous contrast limits the ability to evaluate solid abdominal organs.  Small hiatal hernia. Post gastric bypass. Enteric contrast extends to the level of the level of the small bowel. There is minimal apparent bowel wall thickening within the peri-anastomotic loop of small bowel within the right mid abdomen (coronal image 22, series 3; axial image 37 and 44, series 2), not resulting in enteric obstruction. This finding is without associated adjacent mesenteric stranding. No pneumoperitoneum, pneumatosis or portal venous gas. No definite definable/drainable fluid collection on this noncontrast examination.   Normal hepatic contour.  Post cholecystectomy.  No ascites.  Normal noncontrast appearance of the bilateral kidneys. No renal stones. No urinary obstruction or perinephric stranding. Normal noncontrast appearance of the bilateral adrenal glands, pancreas and spleen.  Normal caliber the abdominal aorta. No definite retroperitoneal, mesenteric, pelvic or inguinal lymphadenopathy on this noncontrast examination.  Post hysterectomy. No discrete adnexal lesions. No free fluid within the pelvic cul-de-sac. Normal noncontrast appearance of the urinary bladder.  Limited visualization of lower thorax demonstrates minimal dependent ground-glass atelectasis, right greater than left. No pleural effusion. No focal airspace opacities. Normal heart size. No pericardial effusion.  No acute or aggressive osseus abnormalities. Unchanged moderate (approximately 40%) compression deformity involving the T12 vertebral body. Unchanged mild to moderate multilevel lumbar spine DDD, worse L3-L4 and L4-L5 with disc space height loss, endplate irregularity and sclerosis. 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  IMPRESSION: 1. Post gastric bypass with minimal apparent bowel wall thickening involving one of the peri-anastomotic loops of small bowel within the right mid hemi abdomen, not resulting in enteric obstruction. Otherwise, no explanation for patient's diffuse abdominal/pelvic pain. 2. Unchanged moderate (approximately 40%) compression deformity involving the T12 vertebral body. 3. Mild to moderate multilevel lumbar spine DDD, worse L3-L4 and L4-L5. 4. 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   Electronically Signed   By: Simonne ComeJohn  Watts M.D.   On: 11/10/2013 22:02     EKG Interpretation None      MDM   Final diagnoses:  Abdominal pain, unspecified abdominal location    Patient feels much better at this time.  CT scan without significant abnormality.  Outpatient followup with the  gastroenterologist.  She states she is working on trying to find one.  She understands return to the ER for new or worsening symptoms    Lyanne CoKevin M Orestes Geiman, MD 11/10/13 2251

## 2013-11-10 NOTE — Progress Notes (Signed)
  CARE MANAGEMENT ED NOTE 11/10/2013  Patient:  Kim Allison,Kim Allison   Account Number:  [id number]  Date Initiated:  11/10/2013  Documentation initiated by:  Radford PaxFERRERO,Cordarro Spinnato  Subjective/Objective Assessment:   Patient presents to Ed with lower abdominal pain, n/v/d and decreased appetite     Subjective/Objective Assessment Detail:   Patient with pmhx of gastric bypass     Action/Plan:   Action/Plan Detail:   Anticipated DC Date:       Status Recommendation to Physician:   Result of Recommendation:    Other ED Services  Consult Working Plan    DC Planning Services  Other  PCP issues    Choice offered to / List presented to:            Status of service:  Completed, signed off  ED Comments:   ED Comments Detail:  EDCM spoke to patient at bedside. Patient confirms she does not have a pcp or insurance [place]. EDCM provide patient [facility], informed patient of services there and walk in times.  EDCM also provided patient with list of pcps who accept self pay patients, list of discount pharmacies and websites needymeds.org and GoodRX.com for medication assistance, phone number to inquire about the orange card, phone number to inquire about Mediciad, phone number to inquire about the Affordable Care Act, financial resources in the community such as local churches, [employer], urban ministries, and dental assistance for uninsured patients. Patient thankfulf or resources.  No further EDCM needs at this time.

## 2016-06-14 IMAGING — CT CT ABD-PELV W/O CM
1 of 2 series · 14 of 32 positions shown, 18 images · non-contrast
Comparison: CT abdomen and pelvis - 04/04/2013

CLINICAL DATA: Diffuse pelvic pain below the umbilicus as started
approximately 3 days ago at home. Diarrhea. No vomiting.

EXAM:
CT ABDOMEN AND PELVIS WITHOUT CONTRAST
TECHNIQUE: Multidetector CT imaging of the abdomen and pelvis was performed
following the standard protocol without IV contrast.

[Series 2: abd/pel w/o · axial · non-contrast · 0.87mm/px · z∈[-383,-13]mm · 14 of 85 slices shown, 18 images]
[im 7/85  soft-tissue]
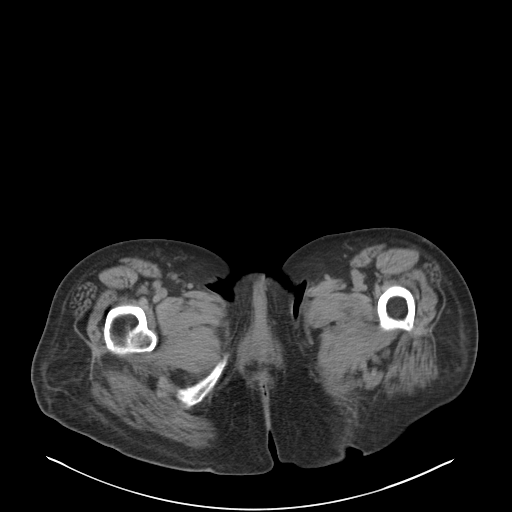
[im 7/85  bone]
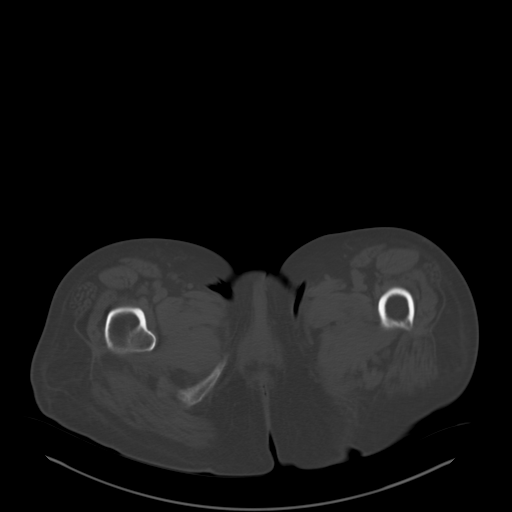
[im 14/85  soft-tissue]
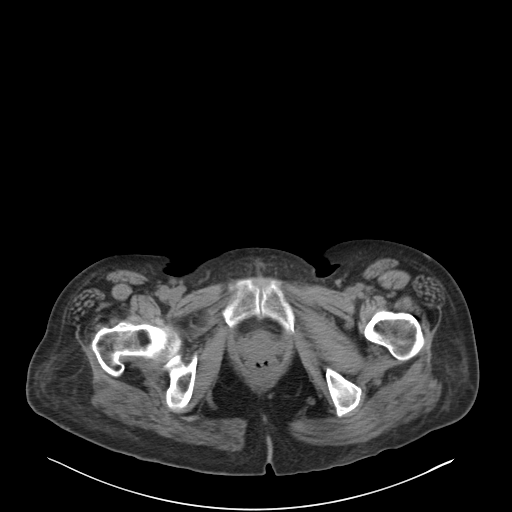
[im 21/85  soft-tissue]
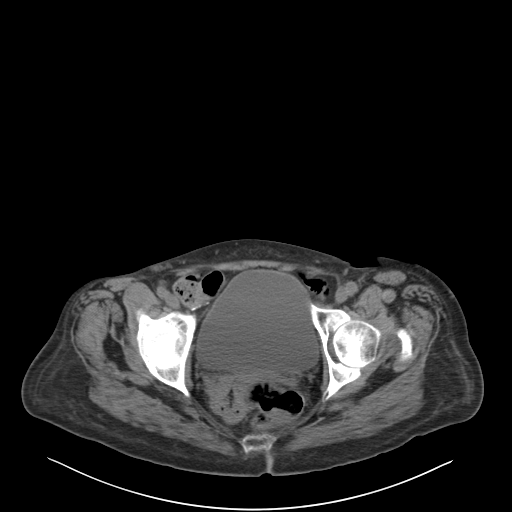
[im 27/85  soft-tissue]
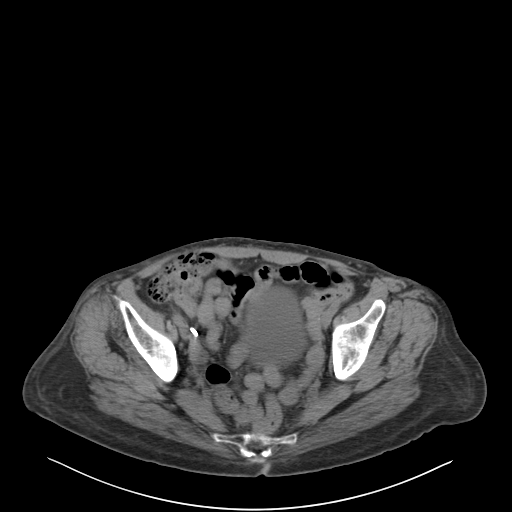
[im 34/85  soft-tissue]
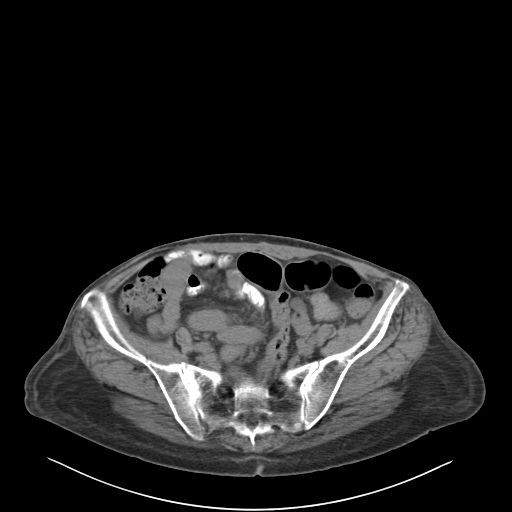
[im 41/85  soft-tissue]
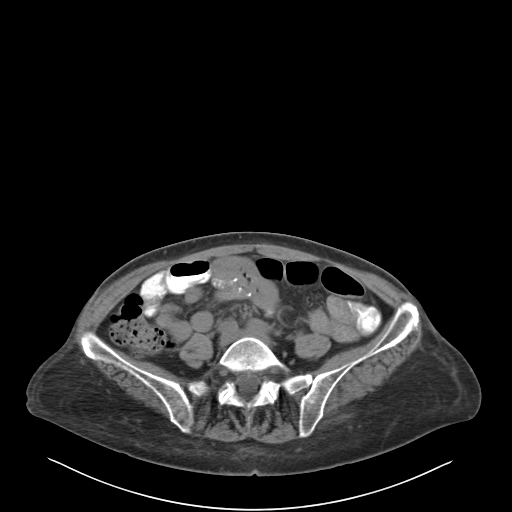
[im 48/85  soft-tissue]
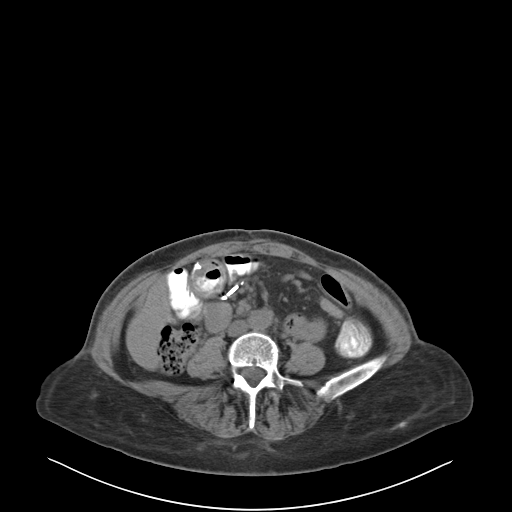
[im 54/85  soft-tissue]
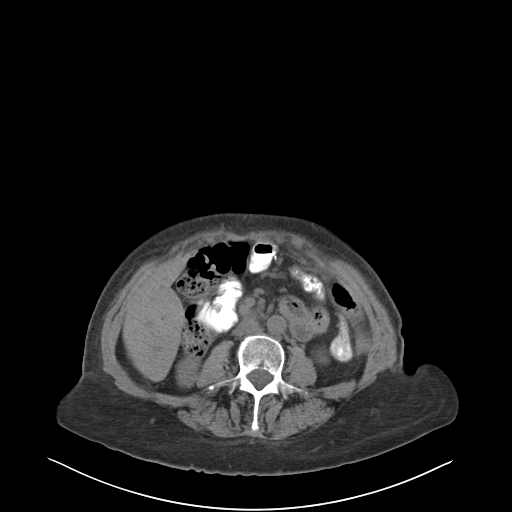
[im 61/85  soft-tissue]
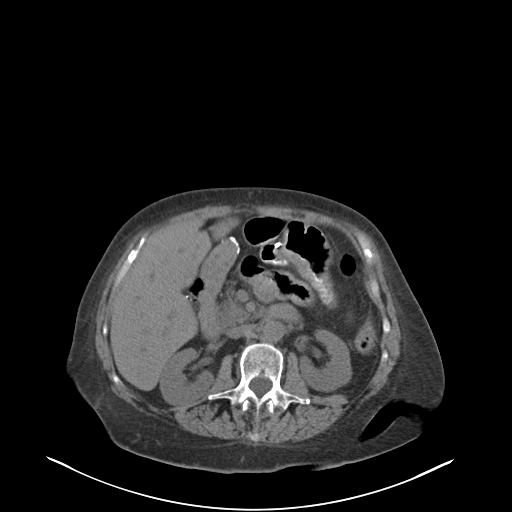
[im 61/85  bone]
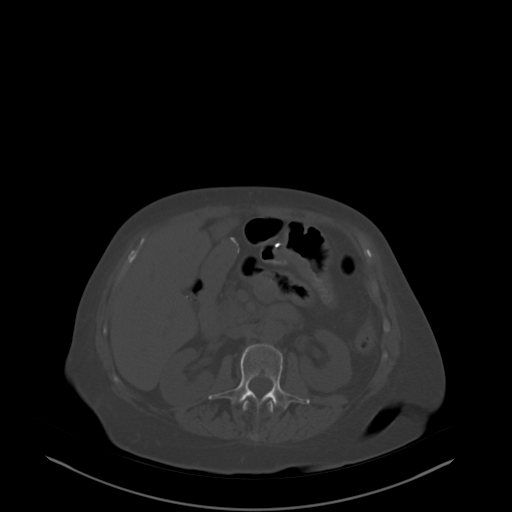
[im 68/85  soft-tissue]
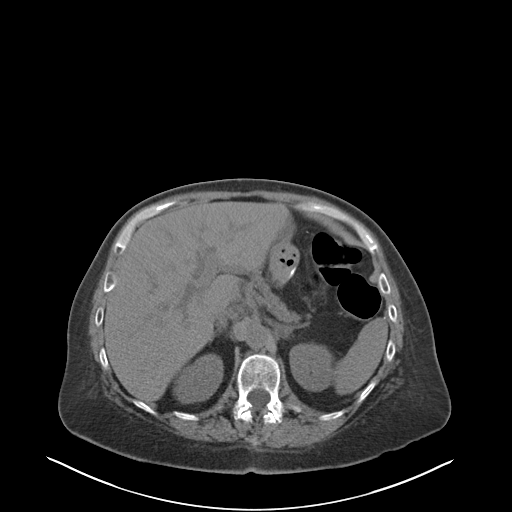
[im 71/85  lung]
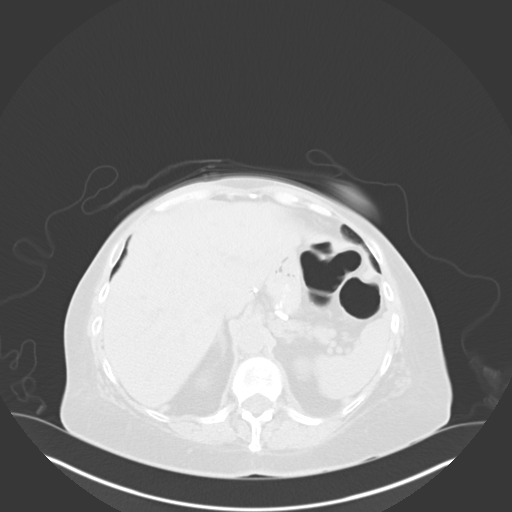
[im 74/85  soft-tissue]
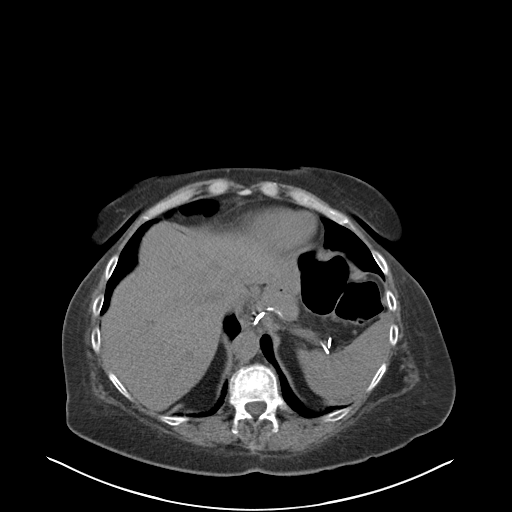
[im 74/85  lung]
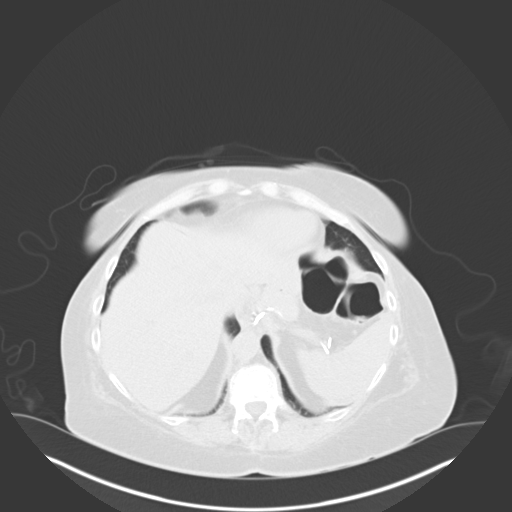
[im 78/85  lung]
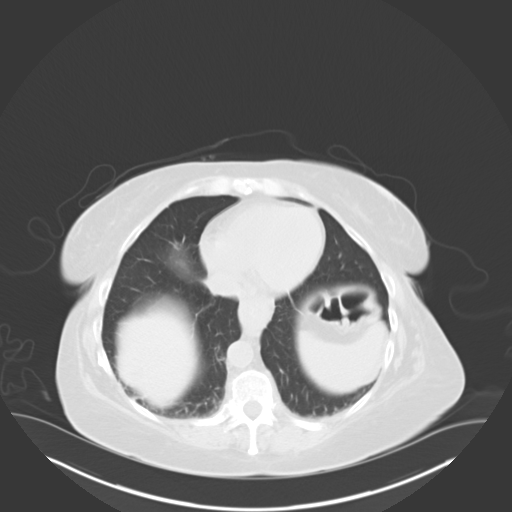
[im 81/85  soft-tissue]
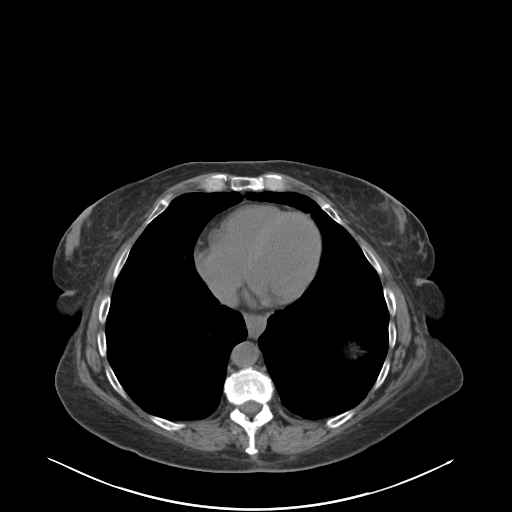
[im 81/85  lung]
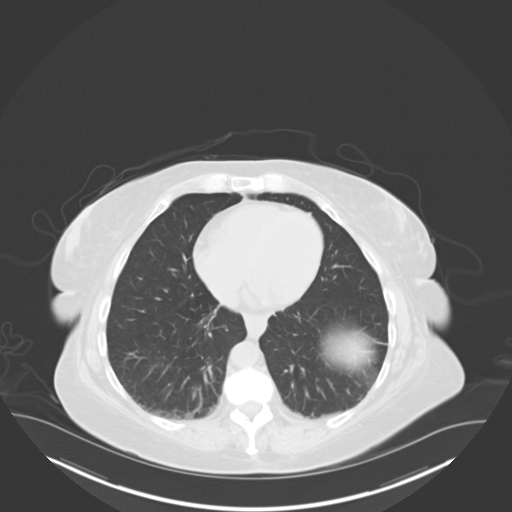

[14 of 32 positions shown; findings below may reference images not displayed]

FINDINGS: The lack of intravenous contrast limits the ability to evaluate
solid abdominal organs.

Small hiatal hernia. Post gastric bypass. Enteric contrast extends
to the level of the level of the small bowel. There is minimal
apparent bowel wall thickening within the peri-anastomotic loop of
small bowel within the right mid abdomen (coronal image 22, series
3; axial image 37 and 44, series 2), not resulting in enteric
obstruction. This finding is without associated adjacent mesenteric
stranding. No pneumoperitoneum, pneumatosis or portal venous gas. No
definite definable/drainable fluid collection on this noncontrast
examination.

Normal hepatic contour.  Post cholecystectomy.  No ascites.

Normal noncontrast appearance of the bilateral kidneys. No renal
stones. No urinary obstruction or perinephric stranding. Normal
noncontrast appearance of the bilateral adrenal glands, pancreas and
spleen.

Normal caliber the abdominal aorta. No definite retroperitoneal,
mesenteric, pelvic or inguinal lymphadenopathy on this noncontrast
examination.

Post hysterectomy. No discrete adnexal lesions. No free fluid within
the pelvic cul-de-sac. Normal noncontrast appearance of the urinary
bladder.

Limited visualization of lower thorax demonstrates minimal dependent
ground-glass atelectasis, right greater than left. No pleural
effusion. No focal airspace opacities. Normal heart size. No
pericardial effusion.

No acute or aggressive osseus abnormalities. Unchanged moderate
(approximately 40%) compression deformity involving the T12
vertebral body. Unchanged mild to moderate multilevel lumbar spine
DDD, worse L3-L4 and L4-L5 with disc space height loss, endplate
irregularity and sclerosis. Bilateral L5 pars defects without
associated anterolisthesis.
IMPRESSION: 1. Post gastric bypass with minimal apparent bowel wall thickening
involving one of the peri-anastomotic loops of small bowel within
the right mid hemi abdomen, not resulting in enteric obstruction.
Otherwise, no explanation for patient's diffuse abdominal/pelvic
pain.
2. Unchanged moderate (approximately 40%) compression deformity
involving the T12 vertebral body.
3. Mild to moderate multilevel lumbar spine DDD, worse L3-L4 and
L4-L5.
4. Bilateral L5 pars defects without associated anterolisthesis.

## 2023-07-19 ENCOUNTER — Emergency Department (HOSPITAL_COMMUNITY)
Admission: EM | Admit: 2023-07-19 | Discharge: 2023-07-19 | Disposition: A | Discharge status: Home / Self Care | Payer: No Typology Code available for payment source | Attending: Emergency Medicine | Admitting: Emergency Medicine

## 2023-07-19 ENCOUNTER — Emergency Department (HOSPITAL_COMMUNITY): Payer: No Typology Code available for payment source

## 2023-07-19 DIAGNOSIS — Z7289 Other problems related to lifestyle: Secondary | ICD-10-CM | POA: Insufficient documentation

## 2023-07-19 DIAGNOSIS — R110 Nausea: Secondary | ICD-10-CM | POA: Diagnosis not present

## 2023-07-19 DIAGNOSIS — R1084 Generalized abdominal pain: Secondary | ICD-10-CM | POA: Insufficient documentation

## 2023-07-19 DIAGNOSIS — R1030 Lower abdominal pain, unspecified: Secondary | ICD-10-CM | POA: Diagnosis present

## 2023-07-19 DIAGNOSIS — Z765 Malingerer [conscious simulation]: Secondary | ICD-10-CM

## 2023-07-19 LAB — URINALYSIS, ROUTINE W REFLEX MICROSCOPIC
Bacteria, UA: NONE SEEN
Bilirubin Urine: NEGATIVE
Glucose, UA: NEGATIVE mg/dL
Ketones, ur: NEGATIVE mg/dL
Leukocytes,Ua: NEGATIVE
Nitrite: NEGATIVE
Protein, ur: NEGATIVE mg/dL
Specific Gravity, Urine: 1.015 (ref 1.005–1.030)
pH: 5 (ref 5.0–8.0)

## 2023-07-19 LAB — CBC
HCT: 40.7 % (ref 36.0–46.0)
Hemoglobin: 12.4 g/dL (ref 12.0–15.0)
MCH: 25.2 pg — ABNORMAL LOW (ref 26.0–34.0)
MCHC: 30.5 g/dL (ref 30.0–36.0)
MCV: 82.6 fL (ref 80.0–100.0)
Platelets: 444 10*3/uL — ABNORMAL HIGH (ref 150–400)
RBC: 4.93 MIL/uL (ref 3.87–5.11)
RDW: 17.6 % — ABNORMAL HIGH (ref 11.5–15.5)
WBC: 5.2 10*3/uL (ref 4.0–10.5)
nRBC: 0 % (ref 0.0–0.2)

## 2023-07-19 LAB — COMPREHENSIVE METABOLIC PANEL WITH GFR
ALT: 7 U/L (ref 0–44)
AST: 14 U/L — ABNORMAL LOW (ref 15–41)
Albumin: 3.3 g/dL — ABNORMAL LOW (ref 3.5–5.0)
Alkaline Phosphatase: 88 U/L (ref 38–126)
Anion gap: 10 (ref 5–15)
BUN: 20 mg/dL (ref 8–23)
CO2: 25 mmol/L (ref 22–32)
Calcium: 8.7 mg/dL — ABNORMAL LOW (ref 8.9–10.3)
Chloride: 103 mmol/L (ref 98–111)
Creatinine, Ser: 1.42 mg/dL — ABNORMAL HIGH (ref 0.44–1.00)
GFR, Estimated: 42 mL/min — ABNORMAL LOW (ref >60)
Glucose, Bld: 116 mg/dL — ABNORMAL HIGH (ref 70–99)
Potassium: 5.2 mmol/L — ABNORMAL HIGH (ref 3.5–5.1)
Sodium: 138 mmol/L (ref 135–145)
Total Bilirubin: 0.9 mg/dL (ref 0.0–1.2)
Total Protein: 6.9 g/dL (ref 6.5–8.1)

## 2023-07-19 LAB — I-STAT CHEM 8, ED
BUN: 22 mg/dL (ref 8–23)
Calcium, Ion: 1.05 mmol/L — ABNORMAL LOW (ref 1.15–1.40)
Chloride: 105 mmol/L (ref 98–111)
Creatinine, Ser: 1.1 mg/dL — ABNORMAL HIGH (ref 0.44–1.00)
Glucose, Bld: 87 mg/dL (ref 70–99)
HCT: 32 % — ABNORMAL LOW (ref 36.0–46.0)
Hemoglobin: 10.9 g/dL — ABNORMAL LOW (ref 12.0–15.0)
Potassium: 4.4 mmol/L (ref 3.5–5.1)
Sodium: 137 mmol/L (ref 135–145)
TCO2: 25 mmol/L (ref 22–32)

## 2023-07-19 LAB — LIPASE, BLOOD: Lipase: 23 U/L (ref 11–51)

## 2023-07-19 MED ORDER — ONDANSETRON 4 MG PO TBDP
4.0000 mg | ORAL_TABLET | Freq: Once | ORAL | Status: AC
  Administered 2023-07-19: 4 mg via ORAL
  Filled 2023-07-19: qty 1

## 2023-07-19 MED ORDER — HYDROMORPHONE HCL 1 MG/ML IJ SOLN
1.0000 mg | Freq: Once | INTRAMUSCULAR | Status: AC
  Administered 2023-07-19: 1 mg via INTRAVENOUS
  Filled 2023-07-19: qty 1

## 2023-07-19 MED ORDER — SODIUM CHLORIDE 0.9 % IV BOLUS
1000.0000 mL | Freq: Once | INTRAVENOUS | Status: AC
  Administered 2023-07-19: 1000 mL via INTRAVENOUS

## 2023-07-19 MED ORDER — IOHEXOL 300 MG/ML  SOLN
100.0000 mL | Freq: Once | INTRAMUSCULAR | Status: AC | PRN
  Administered 2023-07-19: 100 mL via INTRAVENOUS

## 2023-07-19 MED ORDER — DICYCLOMINE HCL 10 MG/ML IM SOLN
10.0000 mg | Freq: Once | INTRAMUSCULAR | Status: AC
  Administered 2023-07-19: 10 mg via INTRAMUSCULAR
  Filled 2023-07-19: qty 2

## 2023-07-19 MED ORDER — ONDANSETRON HCL 4 MG/2ML IJ SOLN
4.0000 mg | Freq: Once | INTRAMUSCULAR | Status: AC
  Administered 2023-07-19: 4 mg via INTRAVENOUS
  Filled 2023-07-19: qty 2

## 2023-07-19 NOTE — ED Triage Notes (Signed)
 Patient complaining of severe abdominal pain x 3 days, along with nausea. Pain mainly in lower abdomen. Rates pain 8/10.

## 2023-07-19 NOTE — ED Provider Notes (Signed)
 [place] EMERGENCY DEPARTMENT AT [facility] Provider Note   CSN: [id number] Arrival date & time: 07/19/23  1241     Patient presents with: Abdominal Pain   Kim Allison is a 63 y.o. female with a history of arthritis and gastric bypass surgery presents the ED for abdominal pain.  Patient reports lower abdominal pain for the past 3 days with associated nausea.  States that oxycodone  does not help with her pain.  States that she has not eaten anything in 2 days but she is currently able to take small sips of Sprite.  No fevers.  No vomiting.  No changes to urinary or bowel habits.  No additional complaints or concerns at this time.    Prior to Admission medications   Medication Sig Start Date End Date Taking? Authorizing Provider  ondansetron  (ZOFRAN  ODT) 8 MG disintegrating tablet Take 1 tablet (8 mg total) by mouth every 8 (eight) hours as needed for nausea or vomiting. 11/10/13   Nannette Babe, MD  oxyCODONE -acetaminophen  (PERCOCET/ROXICET) 5-325 MG per tablet Take 1 tablet by mouth every 4 (four) hours as needed for severe pain. 11/10/13   Nannette Babe, MD    Allergies: B12-c-des gastric sub-fe [chromagen], Morphine and codeine, and Vicodin [hydrocodone-acetaminophen ]    Review of Systems  Gastrointestinal:  Positive for abdominal pain.  All other systems reviewed and are negative.   Updated Vital Signs BP (!) 147/81   Pulse 77   Temp 97.6 F (36.4 C) (Oral)   Resp 16   Ht 5' 2 (1.575 m)   Wt 64.4 kg   SpO2 100%   BMI 25.97 kg/m   Physical Exam Vitals and nursing note reviewed.  Constitutional:      General: She is not in acute distress.    Appearance: Normal appearance.  HENT:     Head: Normocephalic and atraumatic.     Mouth/Throat:     Mouth: Mucous membranes are moist.   Eyes:     Conjunctiva/sclera: Conjunctivae normal.     Pupils: Pupils are equal, round, and reactive to light.    Cardiovascular:     Rate and Rhythm: Normal rate and regular  rhythm.     Pulses: Normal pulses.  Pulmonary:     Effort: Pulmonary effort is normal.     Breath sounds: Normal breath sounds.  Abdominal:     Palpations: Abdomen is soft.     Tenderness: There is no abdominal tenderness. There is no right CVA tenderness or left CVA tenderness.   Musculoskeletal:        General: Normal range of motion.     Cervical back: Normal range of motion.   Skin:    General: Skin is warm and dry.     Findings: No rash.   Neurological:     General: No focal deficit present.     Mental Status: She is alert.   Psychiatric:        Mood and Affect: Mood normal.        Behavior: Behavior normal.    (all labs ordered are listed, but only abnormal results are displayed) Labs Reviewed  COMPREHENSIVE METABOLIC PANEL WITH GFR - Abnormal; Notable for the following components:      Result Value   Potassium 5.2 (*)    Glucose, Bld 116 (*)    Creatinine, Ser 1.42 (*)    Calcium 8.7 (*)    Albumin 3.3 (*)    AST 14 (*)    GFR, Estimated 42 (*)  All other components within normal limits  CBC - Abnormal; Notable for the following components:   MCH 25.2 (*)    RDW 17.6 (*)    Platelets 444 (*)    All other components within normal limits  URINALYSIS, ROUTINE W REFLEX MICROSCOPIC - Abnormal; Notable for the following components:   Hgb urine dipstick SMALL (*)    All other components within normal limits  I-STAT CHEM 8, ED - Abnormal; Notable for the following components:   Creatinine, Ser 1.10 (*)    Calcium, Ion 1.05 (*)    Hemoglobin 10.9 (*)    HCT 32.0 (*)    All other components within normal limits  LIPASE, BLOOD    EKG: None  Radiology: CT ABDOMEN PELVIS W CONTRAST Result Date: 07/19/2023 CLINICAL DATA:  Severe abdominal pain for 3 days, nausea EXAM: CT ABDOMEN AND PELVIS WITH CONTRAST TECHNIQUE: Multidetector CT imaging of the abdomen and pelvis was performed using the standard protocol following bolus administration of intravenous contrast.  RADIATION DOSE REDUCTION: This exam was performed according to the departmental dose-optimization program which includes automated exposure control, adjustment of the mA and/or kV according to patient size and/or use of iterative reconstruction technique. CONTRAST:  OMNIPAQUE  IOHEXOL  300 MG/ML  SOLN COMPARISON:  11/10/2013 FINDINGS: Lower chest: No acute pleural or parenchymal lung disease. Hepatobiliary: Prior cholecystectomy. Diffuse hepatic steatosis. No focal liver abnormality. Stable chronic intrahepatic and extrahepatic biliary duct dilation, with common bile duct measuring up to 16 mm in diameter. No evidence of choledocholithiasis. Pancreas: Diffuse pancreatic atrophy. No acute inflammatory changes. Spleen: 1 cm hypodensity inferior margin of the spleen with decreased conspicuity on delayed imaging likely a small hemangioma. Otherwise the spleen is normal in size and appearance. Adrenals/Urinary Tract: Mild bilateral renal cortical thinning. No focal renal abnormality. The bladder is decompressed, limiting its evaluation. The adrenals are stable. Stomach/Bowel: No bowel obstruction or ileus. Prior bariatric surgery. No bowel wall thickening or inflammatory change. Vascular/Lymphatic: No significant vascular findings are present. No enlarged abdominal or pelvic lymph nodes. Reproductive: Status post hysterectomy. No adnexal masses. Other: No free fluid or free intraperitoneal gas. No abdominal wall hernia. Musculoskeletal: No acute or destructive bony abnormalities. Chronic T12 compression deformity. Stable bilateral L5 spondylolysis without spondylolisthesis. Extensive multilevel thoracolumbar spondylosis and facet hypertrophy has progressed since prior exam. Reconstructed images demonstrate no additional findings. IMPRESSION: 1. No acute intra-abdominal or intrapelvic process. 2. Stable chronic intrahepatic and extrahepatic biliary duct dilation, which may be physiologic in a patient status post  cholecystectomy. 3. Hepatic steatosis. Electronically Signed   By: Bobbye Burrow M.D.   On: 07/19/2023 16:01     Procedures   Medications Ordered in the ED  ondansetron  (ZOFRAN -ODT) disintegrating tablet 4 mg (4 mg Oral Given 07/19/23 1424)  HYDROmorphone  (DILAUDID ) injection 1 mg (1 mg Intravenous Given 07/19/23 1559)  sodium chloride  0.9 % bolus 1,000 mL (0 mLs Intravenous Stopped 07/19/23 1729)  iohexol  (OMNIPAQUE ) 300 MG/ML solution 100 mL (100 mLs Intravenous Contrast Given 07/19/23 1541)  ondansetron  (ZOFRAN ) injection 4 mg (4 mg Intravenous Given 07/19/23 1720)  dicyclomine (BENTYL) injection 10 mg (10 mg Intramuscular Given 07/19/23 1719)                                    Medical Decision Making Amount and/or Complexity of Data Reviewed Labs: ordered. Radiology: ordered.  Risk Prescription drug management.   This patient presents to the ED  for concern of abdominal pain, this involves an extensive number of treatment options, and is a complaint that carries with it a high risk of complications and morbidity.   Differential diagnosis includes: UTI, pyelonephritis, kidney stone, colitis, gastroenteritis, IBS, IBD, bowel obstruction, perforation, etc.   Comorbidities  See HPI above   Additional History  Additional history obtained from prior   Lab Tests  I ordered and personally interpreted labs.  The pertinent results include:   CMP tenderness elevated potassium 3.2, creatinine 1.42, GFR of 42 Repeat i-STAT shows potassium within normal limits and Cr improved to 1.1 CBC is reassuring Lipase unremarkable UA is reassuring   Imaging Studies  I ordered imaging studies including Ct abdomen/pelvis  I independently visualized and interpreted imaging which showed:  No acute intra-abdominal or intrapelvic process. Stable chronic intrahepatic and extrahepatic biliary duct dilation, may be physiologic in patient s/p cholecystectomy. Hepatic steatosis. I agree with the  radiologist interpretation   Problem List / ED Course / Critical Interventions / Medication Management  Patient reports lower abdominal pain for the past 3 days associated nausea.  No fevers, vomiting, changes in urinary or bowel habits. On examination there is no tenderness to palpation of the lower abdomen patient reports of pain. History of gastric bypass in the past, states that she feels like her pain is related to that. Patient has been able to drink fluids throughout her time in the ED. Patient given dilaudid  and zofran  initially for pain. When the results came back and were unremarkable, gave patient bentyl and zofran  when pain returned. Patient told nurse to take out her IV if she wasn't going to get more dilaudid . Patient then walked out of the ED. She's most likely drug seeking.   Social Determinants of Health  Tobacco use   Test / Admission - Considered  Patient eloped from ED prior to completion of medical evaluation.    Final diagnoses:  Generalized abdominal pain  Drug-seeking behavior    ED Discharge Orders     None          Sonnie Dusky, PA-C 07/19/23 1748    Deatra Face, MD 07/19/23 2137

## 2023-07-24 ENCOUNTER — Emergency Department (HOSPITAL_COMMUNITY)

## 2023-07-24 ENCOUNTER — Other Ambulatory Visit: Payer: Self-pay

## 2023-07-24 ENCOUNTER — Emergency Department (HOSPITAL_COMMUNITY)
Admission: EM | Admit: 2023-07-24 | Discharge: 2023-07-24 | Disposition: A | Discharge status: Home / Self Care | Attending: Emergency Medicine | Admitting: Emergency Medicine

## 2023-07-24 DIAGNOSIS — M545 Low back pain, unspecified: Secondary | ICD-10-CM | POA: Diagnosis present

## 2023-07-24 DIAGNOSIS — W109XXA Fall (on) (from) unspecified stairs and steps, initial encounter: Secondary | ICD-10-CM | POA: Diagnosis not present

## 2023-07-24 DIAGNOSIS — W19XXXA Unspecified fall, initial encounter: Secondary | ICD-10-CM

## 2023-07-24 DIAGNOSIS — M62830 Muscle spasm of back: Secondary | ICD-10-CM | POA: Insufficient documentation

## 2023-07-24 MED ORDER — OXYCODONE-ACETAMINOPHEN 5-325 MG PO TABS
2.0000 | ORAL_TABLET | Freq: Once | ORAL | Status: AC
  Administered 2023-07-24: 2 via ORAL
  Filled 2023-07-24: qty 2

## 2023-07-24 NOTE — ED Triage Notes (Signed)
 Pt states that she was helping a friend move furniture. Pt missed a step and fell down. Pt states she was on the first step when she fell. Pt states that she did not hit her head or lose consciousness. Pt is able to ambulate without assistance. Pt complaining of severe back pain.

## 2023-07-24 NOTE — Discharge Instructions (Signed)
 The x-ray of your lumbar spine did not show any acute findings.  You may continue to take your pain medication that you are prescribed to at home.  Return to the ED if any symptoms worsen.

## 2023-07-24 NOTE — ED Provider Notes (Signed)
 [place] EMERGENCY DEPARTMENT AT [facility] Provider Note   CSN: [id number] Arrival date & time: 07/24/23  1640     Patient presents with: Fall and Back Pain   Kim Allison is a 63 y.o. female.  63 year old female with a past medical history of chronic back pain presents to the ED status post mechanical fall.  Patient reports she was helping a neighbor move some heavy TV down the steps when she suddenly missed a step, reports falling and landing on her buttocks.  She has most of her pain along her lumbar spine, has been able to ambulate but continues to have pain with any type of movement.  She does have prior history of back intervention, therefore she wanted to be evaluated to make sure she did not injured her back any worse.  She denies any bowel or bladder complaints.  She did not strike her head, there was no loss of consciousness.  She denies any other injury present.  The history is provided by the patient.  Fall This is a new problem.  Back Pain Associated symptoms: no fever        Prior to Admission medications   Medication Sig Start Date End Date Taking? Authorizing Provider  ondansetron  (ZOFRAN  ODT) 8 MG disintegrating tablet Take 1 tablet (8 mg total) by mouth every 8 (eight) hours as needed for nausea or vomiting. 11/10/13   Nannette Babe, MD  oxyCODONE -acetaminophen  (PERCOCET/ROXICET) 5-325 MG per tablet Take 1 tablet by mouth every 4 (four) hours as needed for severe pain. 11/10/13   Nannette Babe, MD    Allergies: B12-c-des gastric sub-fe [chromagen], Morphine and codeine, and Vicodin [hydrocodone-acetaminophen ]    Review of Systems  Constitutional:  Negative for fever.  Musculoskeletal:  Positive for back pain.    Updated Vital Signs BP (!) 176/128 (BP Location: Right Arm)   Pulse 78   Temp 98 F (36.7 C) (Oral)   Resp 16   Ht 5' 2 (1.575 m)   Wt 64 kg   SpO2 100%   BMI 25.81 kg/m   Physical Exam Vitals and nursing note reviewed.   Constitutional:      General: She is not in acute distress.    Appearance: She is well-developed.  HENT:     Head: Normocephalic and atraumatic.     Mouth/Throat:     Pharynx: No oropharyngeal exudate.   Eyes:     Pupils: Pupils are equal, round, and reactive to light.    Cardiovascular:     Rate and Rhythm: Regular rhythm.     Heart sounds: Normal heart sounds.  Pulmonary:     Effort: Pulmonary effort is normal. No respiratory distress.     Breath sounds: Normal breath sounds.  Abdominal:     General: Bowel sounds are normal. There is no distension.     Palpations: Abdomen is soft.     Tenderness: There is no abdominal tenderness.   Musculoskeletal:        General: No deformity.     Cervical back: Normal range of motion.     Lumbar back: Spasms and tenderness present. No bony tenderness. Negative right straight leg raise test and negative left straight leg raise test.     Right lower leg: No edema.     Left lower leg: No edema.     Comments: RLE- KF,KE 5/5 strength LLE- HF, HE 5/5 strength Normal gait. No pronator drift. No leg drop. . CN I, II and VIII not tested.  CN II-XII grossly intact bilaterally.       Skin:    General: Skin is warm and dry.   Neurological:     Mental Status: She is alert and oriented to person, place, and time.     (all labs ordered are listed, but only abnormal results are displayed) Labs Reviewed - No data to display  EKG: None  Radiology: DG Lumbar Spine Complete Result Date: 07/24/2023 CLINICAL DATA:  Marvell Slider helping a friend.  TB today. EXAM: LUMBAR SPINE - COMPLETE 4+ VIEW COMPARISON:  CT abdomen and pelvis 07/19/2023 FINDINGS: There is diffuse decreased bone mineralization. There are 5 non-rib-bearing lumbar-type vertebral bodies. Moderate levocurvature centered at L2-3 with Cobb angle measuring 26 degrees from the superior T11 through the inferior L4 levels. The lumbar vertebral body heights are maintained. Moderate anterior T12  vertebral body height loss is unchanged from 07/19/2023 and chronic. Peripherally corticated linear lucencies overlying the pars interarticularis region it L5, chronic pars defects as seen on prior CTs in 2025 and 2015. Moderate right L1-2, moderate to severe right L2-3, severe right L3-4, severe posterior left L4-5, and moderate left and posterior L5-S1 disc space narrowing. L2-3 and T12-L1 vacuum disc again noted. Moderate facet joint arthropathy of the lower lumbar spine. Numerous surgical clips overlie the upper abdomen, status post prior gastric surgery and cholecystectomy. IMPRESSION: 1. No acute fracture. 2. Moderate levocurvature centered at L2-3. 3. Chronic T12 vertebral body height loss. 4. 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 5. Multilevel degenerative disc and joint changes as above. Electronically Signed   By: Bertina Broccoli M.D.   On: 07/24/2023 17:57    Procedures   Medications Ordered in the ED  oxyCODONE -acetaminophen  (PERCOCET/ROXICET) 5-325 MG per tablet 2 tablet (has no administration in time range)                                Medical Decision Making Amount and/or Complexity of Data Reviewed Radiology: ordered.  Risk Prescription drug management.     Patient presented to the ED status post mechanical fall, reports pain to her lumbar spine especially with any type of ambulation.  She does currently have a history of chronic back pain, currently takes Percocet 10 mg at home.  She was helping her friend move when she suddenly fell.  She reports her pain medication is not here in [place] as she currently resides in [place].  X-ray of her lumbar spine obtained did not show any acute findings.   Her exam is reassuring, no bowel or bladder complaints, did not strike her head or loss of consciousness.  Given pain medication while in the emergency department.  Will send her home without any pain meds as PDMP was reviewed and she recently had a prescription of  Percocet tens filled.  She is agreeable with this plan.  Return precautions discussed at length.   Portions of this note were generated with Scientist, clinical (histocompatibility and immunogenetics). Dictation errors may occur despite best attempts at proofreading.  Final diagnoses:  Fall, initial encounter  Midline low back pain without sciatica, unspecified chronicity    ED Discharge Orders     None          Lynden Carrithers, PA-C 07/24/23 1835    Tegeler, Marine Sia, MD 07/24/23 [phone]

## 2023-07-30 ENCOUNTER — Emergency Department (HOSPITAL_COMMUNITY): Admission: EM | Admit: 2023-07-30 | Discharge: 2023-07-30 | Discharge status: Against Medical Advice

## 2023-08-13 NOTE — ED Provider Notes (Signed)
 Chief Complaint  Patient presents with  . Heartburn       HPI  History provided by:  Patient Medication Refill Medications/supplies requested:  Patient reports she hasnt had her reflux medication Reason for refill: patient reports medications in her home and she cant get to them tonight. Patient has complete original prescription information: no       Patient History Medical History[1] Surgical History[2] Family History[3] Social History[4]    Review of Systems Review of Systems  Constitutional:  Negative for fever.  HENT:  Negative for drooling, sore throat and trouble swallowing.   Eyes:  Negative for redness.  Respiratory:  Negative for shortness of breath.   Cardiovascular:  Positive for chest pain.  Gastrointestinal:  Negative for nausea and vomiting.  Genitourinary:  Negative for dysuria.  Musculoskeletal:  Negative for arthralgias and myalgias.  Skin:  Negative for rash.  Neurological:  Negative for light-headedness.  Psychiatric/Behavioral:  Negative for agitation and confusion. The patient is nervous/anxious.       Physical Exam ED Triage Vitals [08/13/23 2146]  Temp 97.5 F (36.4 C)  Heart Rate 67  Resp 18  BP (!) 180/114  MAP (mmHg) 132  SpO2 100 %  O2 Device   O2 Flow Rate (L/min)   Weight 62.6 kg (138 lb)   Physical Exam Constitutional:      General: She is not in acute distress. HENT:     Head: Normocephalic and atraumatic.     Nose: Nose normal.     Mouth/Throat:     Mouth: Mucous membranes are moist.   Eyes:     Extraocular Movements: Extraocular movements intact.     Pupils: Pupils are equal, round, and reactive to light.    Cardiovascular:     Rate and Rhythm: Normal rate and regular rhythm.  Pulmonary:     Effort: Pulmonary effort is normal.     Breath sounds: Normal breath sounds.  Abdominal:     Palpations: Abdomen is soft.     Tenderness: There is no abdominal tenderness.   Musculoskeletal:         General: Normal range of motion.     Cervical back: Normal range of motion.   Skin:    General: Skin is warm.     Findings: No rash.   Neurological:     General: No focal deficit present.     Mental Status: She is alert.   Psychiatric:        Mood and Affect: Mood is anxious.        Thought Content: Thought content does not include homicidal or suicidal ideation.        CHA2DS2-VASc Score: N/A  Glasgow Coma Scale Score: 15                 Procedures                       ED Course & MDM   Medical Decision Making 63 year old female with past medical and surgical history as noted above who presents in the setting of heartburn.  Patient reports she has had this chronically for years.  She reports she has recently had difficulty getting into her home and unfortunately has not been able to get her medication she takes which is omeprazole.  She reports she has had burning sensation in her chest that is exactly like her chronic heartburn.  Nothing obviously makes this  better outside of her medication.  Eating and drinking makes it worse.  No shortness of breath.  No radiation of pain.  Also reports anxiety which is her baseline and does not have her anxiety medications well.  No thoughts of harming self or harming others but due to this that she came in today further evaluation.  On arrival patient was hemodynamically stable and afebrile.  On exam patient was slightly anxious but continued denies any plan to harm self or harm others.  Lungs clear to auscultation.  No murmurs rubs or gallops.  No reproducible pain.  Discussed with patient possibly getting EKG or other evaluation with chest burning but she is adamant this is her chronic heartburn and she is not concerned for anything else.  I would have low suspicion for ACS, pulmonary embolism or other acute pathology to thoracic area at this time.  She would like a dose of omeprazole as well as other medication to help with [place] give her  this.  She reports she has had GI cocktails in the past and will give at this time.  Additionally ask if I can give her one-time dose of nausea medication and something to help with her anxiety this evening.  I advised I am happy to do this.  I do not believe patient is a danger to self or others.  After shared decision making I do not believe further labs or imaging will be indicated.  Advised patient she can return for further evaluation anytime.  She reports she should be able to get her medications tomorrow for her chronic conditions.  After shared decision-making discharge home with strict return precautions stable time of discharge.  Patient was in agreement with plan.  Note: Chief Executive Officer was used in the creation of this note.  Throughout the entirety of patient encounter I wore personal protective equipment in accordance with guidelines at that time. Additionally, if controlled substance prescribed during this visit then appropriate database was reviewed prior to prescription initiation.  Problems Addressed: Heartburn: complicated acute illness or injury  Amount and/or Complexity of Data Reviewed External Data Reviewed: notes.    Details: Most recent discharge summary from [facility]  Risk OTC drugs. Prescription drug management. Decision regarding hospitalization.     No orders to display   PDMP reviewed, no concerning pattern identified  ED Disposition:  Discharge Final diagnoses:  Heartburn    ED Prescriptions   None           [1] Past Medical History: Diagnosis Date  . Acetaminophen  overdose 02/25/2011  . Anxiety   . Arthritis   . Depression 02/25/2011  . History of migraine 02/25/2011  . Migraine   . Pseudoseizure 2011  . Seizure disorder    (CMD) 02/25/2011  . Seizures    (CMD)   . Substance abuse (CMD) 2013  [2] Past Surgical History: Procedure Laterality Date  . APPENDECTOMY  2000   Procedure: APPENDECTOMY  . DIAGNOSTIC  LAPAROSCOPY N/A 01/14/2014   Procedure: DIAGNOSTIC LAPAROSCOPY, LYSIS OF ADHESIONS, CLOSURE PETERSEN DEFECT, UPPER ENDOSCOPY;  Surgeon: Wilfred Enzo Guan, MD;  Location: [facility] MAIN OR;  Service: General;  Laterality: N/A;   E-2 POSTED AT 4:46 PM    . GASTRIC BYPASS     Procedure: GASTRIC BYPASS  . HYSTERECTOMY      Procedure: HYSTERECTOMY  . KNEE ARTHROPLASTY Left 12/30/2012   Procedure: MAKO - PATELLOFEMORAL UNICOMPARTMENTAL KNEE REPLACEMENT - ROBOTIC;  Surgeon: Arley KANDICE Loupe, MD;  Location[facility] MAIN  OR;  Service: Orthopedics;  Laterality: Left;  . KNEE ARTHROPLASTY Right 03/02/2014   Procedure: MAKO - UNICOMPARTMENTAL KNEE REPLACEMENT - ROBOTIC Patellofemoral ;  Surgeon: Arley KANDICE Loupe, MD;  Location: [facility] MAIN OR;  Service: Orthopedics;  Laterality: Right;  Patellofemoral Makoplasty Right Knee  . LAPAROSCOPIC GASTRIC BYPASS  09/1998   Procedure: LAPAROSCOPIC GASTRIC BYPASS  [3] Family History Problem Relation Name Age of Onset  . Hypertension Mother    . Diverticulitis Mother    . Congenital heart disease Father    . Anesthesia problems Neg Hx    [4] Social History Tobacco Use  . Smoking status: Every Day    Current packs/day: 1.00    Types: Cigarettes  . Smokeless tobacco: Never  Substance Use Topics  . Alcohol use: No  . Drug use: Unknown    Types: Heroin    Comment: Drug use: Drug Use: No; denies hx of illicit drug use

## 2023-08-14 ENCOUNTER — Other Ambulatory Visit: Payer: Self-pay

## 2023-08-14 ENCOUNTER — Emergency Department (HOSPITAL_COMMUNITY): Admission: EM | Admit: 2023-08-14 | Discharge: 2023-08-14 | Disposition: A | Discharge status: Home / Self Care

## 2023-08-14 ENCOUNTER — Encounter (HOSPITAL_COMMUNITY): Payer: Self-pay

## 2023-08-14 DIAGNOSIS — I10 Essential (primary) hypertension: Secondary | ICD-10-CM | POA: Diagnosis not present

## 2023-08-14 DIAGNOSIS — K219 Gastro-esophageal reflux disease without esophagitis: Secondary | ICD-10-CM | POA: Diagnosis not present

## 2023-08-14 DIAGNOSIS — D649 Anemia, unspecified: Secondary | ICD-10-CM | POA: Insufficient documentation

## 2023-08-14 DIAGNOSIS — Z79899 Other long term (current) drug therapy: Secondary | ICD-10-CM | POA: Diagnosis not present

## 2023-08-14 DIAGNOSIS — D709 Neutropenia, unspecified: Secondary | ICD-10-CM | POA: Diagnosis not present

## 2023-08-14 DIAGNOSIS — R079 Chest pain, unspecified: Secondary | ICD-10-CM | POA: Diagnosis present

## 2023-08-14 LAB — CBC WITH DIFFERENTIAL/PLATELET
Abs Immature Granulocytes: 0.01 K/uL (ref 0.00–0.07)
Basophils Absolute: 0 K/uL (ref 0.0–0.1)
Basophils Relative: 1 %
Eosinophils Absolute: 0.1 K/uL (ref 0.0–0.5)
Eosinophils Relative: 3 %
HCT: 34.4 % — ABNORMAL LOW (ref 36.0–46.0)
Hemoglobin: 10.3 g/dL — ABNORMAL LOW (ref 12.0–15.0)
Immature Granulocytes: 0 %
Lymphocytes Relative: 29 %
Lymphs Abs: 1.2 K/uL (ref 0.7–4.0)
MCH: 24.9 pg — ABNORMAL LOW (ref 26.0–34.0)
MCHC: 29.9 g/dL — ABNORMAL LOW (ref 30.0–36.0)
MCV: 83.1 fL (ref 80.0–100.0)
Monocytes Absolute: 0.3 K/uL (ref 0.1–1.0)
Monocytes Relative: 9 %
Neutro Abs: 2.3 K/uL (ref 1.7–7.7)
Neutrophils Relative %: 58 %
Platelets: 397 K/uL (ref 150–400)
RBC: 4.14 MIL/uL (ref 3.87–5.11)
RDW: 18.6 % — ABNORMAL HIGH (ref 11.5–15.5)
WBC: 3.9 K/uL — ABNORMAL LOW (ref 4.0–10.5)
nRBC: 0 % (ref 0.0–0.2)

## 2023-08-14 LAB — COMPREHENSIVE METABOLIC PANEL WITH GFR
ALT: 9 U/L (ref 0–44)
AST: 14 U/L — ABNORMAL LOW (ref 15–41)
Albumin: 2.8 g/dL — ABNORMAL LOW (ref 3.5–5.0)
Alkaline Phosphatase: 91 U/L (ref 38–126)
Anion gap: 8 (ref 5–15)
BUN: 16 mg/dL (ref 8–23)
CO2: 26 mmol/L (ref 22–32)
Calcium: 7.9 mg/dL — ABNORMAL LOW (ref 8.9–10.3)
Chloride: 105 mmol/L (ref 98–111)
Creatinine, Ser: 0.89 mg/dL (ref 0.44–1.00)
GFR, Estimated: >60 mL/min (ref >60)
Glucose, Bld: 89 mg/dL (ref 70–99)
Potassium: 3.9 mmol/L (ref 3.5–5.1)
Sodium: 139 mmol/L (ref 135–145)
Total Bilirubin: 0.4 mg/dL (ref 0.0–1.2)
Total Protein: 5.9 g/dL — ABNORMAL LOW (ref 6.5–8.1)

## 2023-08-14 LAB — TROPONIN I (HIGH SENSITIVITY): Troponin I (High Sensitivity): 3 ng/L (ref <18)

## 2023-08-14 MED ORDER — HYDROMORPHONE HCL 1 MG/ML IJ SOLN
1.0000 mg | Freq: Once | INTRAMUSCULAR | Status: AC
  Administered 2023-08-14: 1 mg via INTRAVENOUS
  Filled 2023-08-14: qty 1

## 2023-08-14 MED ORDER — AMLODIPINE BESYLATE 5 MG PO TABS
10.0000 mg | ORAL_TABLET | Freq: Once | ORAL | Status: AC
  Administered 2023-08-14: 10 mg via ORAL
  Filled 2023-08-14: qty 2

## 2023-08-14 MED ORDER — LIDOCAINE VISCOUS HCL 2 % MT SOLN
15.0000 mL | Freq: Once | OROMUCOSAL | Status: AC
  Administered 2023-08-14: 15 mL via ORAL
  Filled 2023-08-14: qty 15

## 2023-08-14 MED ORDER — ALUM & MAG HYDROXIDE-SIMETH 200-200-20 MG/5ML PO SUSP
30.0000 mL | Freq: Once | ORAL | Status: AC
  Administered 2023-08-14: 30 mL via ORAL
  Filled 2023-08-14: qty 30

## 2023-08-14 MED ORDER — ALPRAZOLAM 0.5 MG PO TABS
1.0000 mg | ORAL_TABLET | Freq: Once | ORAL | Status: AC
  Administered 2023-08-14: 1 mg via ORAL
  Filled 2023-08-14: qty 2

## 2023-08-14 MED ORDER — PANTOPRAZOLE SODIUM 40 MG IV SOLR
40.0000 mg | Freq: Once | INTRAVENOUS | Status: AC
  Administered 2023-08-14: 40 mg via INTRAVENOUS
  Filled 2023-08-14: qty 10

## 2023-08-14 MED ORDER — ESOMEPRAZOLE MAGNESIUM 40 MG PO CPDR
40.0000 mg | DELAYED_RELEASE_CAPSULE | Freq: Every day | ORAL | 0 refills | Status: AC
Start: 2023-08-14 — End: ?
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
## 2023-08-14 NOTE — ED Triage Notes (Signed)
 Patient presented to ER with back pain, acid reflux, and high blood pressure. Patient stated a tree fell on her house 3 days ago and she is unable to get to her medications as they're in the house and she is staying in a hotel. Her PCP is currently out of town, so unable to see them for refills/assistance.

## 2023-08-14 NOTE — Discharge Instructions (Addendum)
 I have sent Esomeprazole  to your pharmacy. Take this daily as prescribed for your heartburn starting tomorrow.   Please follow up with your PCP for further refills of your home medications  You were found to have a slightly low calcium level on your labs today. Please increase your dietary intake of calcium with foods such as dairy products, soy products, broccoli, almonds. Please have your PCP monitor this value.    Return to the ER for any chest pain not controlled with your home medications, shortness of breath, any other new or concerning symptoms

## 2023-08-21 ENCOUNTER — Encounter (HOSPITAL_COMMUNITY): Payer: Self-pay | Admitting: Emergency Medicine

## 2023-08-21 ENCOUNTER — Other Ambulatory Visit: Payer: Self-pay

## 2023-08-21 ENCOUNTER — Emergency Department (HOSPITAL_COMMUNITY)
Admission: EM | Admit: 2023-08-21 | Discharge: 2023-08-21 | Disposition: A | Discharge status: Home / Self Care | Attending: Emergency Medicine | Admitting: Emergency Medicine

## 2023-08-21 DIAGNOSIS — Z760 Encounter for issue of repeat prescription: Secondary | ICD-10-CM | POA: Diagnosis present

## 2023-08-21 DIAGNOSIS — I10 Essential (primary) hypertension: Secondary | ICD-10-CM | POA: Diagnosis not present

## 2023-08-21 DIAGNOSIS — Z79899 Other long term (current) drug therapy: Secondary | ICD-10-CM | POA: Insufficient documentation

## 2023-08-21 MED ORDER — AMLODIPINE BESYLATE 5 MG PO TABS
10.0000 mg | ORAL_TABLET | Freq: Once | ORAL | Status: AC
  Administered 2023-08-21: 10 mg via ORAL
  Filled 2023-08-21: qty 2

## 2023-08-21 MED ORDER — ONDANSETRON 4 MG PO TBDP
4.0000 mg | ORAL_TABLET | Freq: Once | ORAL | Status: AC
  Administered 2023-08-21: 4 mg via ORAL
  Filled 2023-08-21: qty 1

## 2023-08-21 MED ORDER — PANTOPRAZOLE SODIUM 40 MG PO TBEC
80.0000 mg | DELAYED_RELEASE_TABLET | Freq: Once | ORAL | Status: AC
  Administered 2023-08-21: 80 mg via ORAL
  Filled 2023-08-21: qty 2

## 2023-08-21 NOTE — Discharge Instructions (Signed)
 Please follow-up with your primary care provider for anxiety and pain medications and further refills as needed.  If you develop any life-threatening symptoms return to the emergency department.

## 2023-08-21 NOTE — ED Triage Notes (Addendum)
 Pt in POV with reported HTN and out of bp meds x 3 days. Pt states a tree fell on her house recently and she has been without her meds since.

## 2023-08-21 NOTE — ED Provider Notes (Signed)
 [place] EMERGENCY DEPARTMENT AT [facility] Provider Note   CSN: [id number] Arrival date & time: 08/21/23  9478     Patient presents with: Hypertension and Out of Meds   Kim Allison is a 63 y.o. female.  Patient presents to the emergency ferment requesting medication refill.  She states she needs a dose of her blood pressure medicine reported to be amlodipine , Zofran , and her PPI.  She states that a tree fell near her home and she is unable to get in her house.  I offered to send a prescription for her medications but the patient states she is unable to afford them as an outpatient now and has follow-up planned with primary care next week.  She denies any complaints at this time.    Hypertension       Prior to Admission medications   Medication Sig Start Date End Date Taking? Authorizing Provider  ALPRAZolam  (XANAX ) 0.5 MG tablet Take 0.5 mg by mouth. 09/21/20   [provider]  amLODipine  (NORVASC ) 10 MG tablet Take 10 mg by mouth. 03/13/20   [provider]  atorvastatin (LIPITOR) 10 MG tablet Take 10 mg by mouth every morning. 08/01/23   [provider]  diclofenac (CATAFLAM) 50 MG tablet Take 50 mg by mouth. 12/24/14   [provider]  esomeprazole  (NEXIUM ) 40 MG capsule Take 1 capsule (40 mg total) by mouth daily. 08/14/23   Veta Palma, PA-C  hydrALAZINE (APRESOLINE) 50 MG tablet  11/04/20   [provider]  losartan (COZAAR) 25 MG tablet Take 25 mg by mouth.    [provider]  lurasidone (LATUDA) 20 MG TABS tablet Take 20 mg by mouth. 03/28/23   [provider]  metoprolol succinate (TOPROL-XL) 100 MG 24 hr tablet Take 100 mg by mouth.    [provider]  ondansetron  (ZOFRAN  ODT) 8 MG disintegrating tablet Take 1 tablet (8 mg total) by mouth every 8 (eight) hours as needed for nausea or vomiting. 11/10/13   Baxter Drivers, MD  oxyCODONE -acetaminophen  (PERCOCET) 10-325 MG tablet 1 tablet Orally  every 6 hours, only if needed for severe pain for 28 days 02/15/20   [provider]  oxyCODONE -acetaminophen  (PERCOCET/ROXICET) 5-325 MG per tablet Take 1 tablet by mouth every 4 (four) hours as needed for severe pain. 11/10/13   Baxter Drivers, MD  pregabalin (LYRICA) 75 MG capsule Take 75 mg by mouth. 03/07/14   [provider]  promethazine-phenylephrine (PROMETHAZINE VC) 6.25-5 MG/5ML SYRP Take 5 mLs by mouth every 6 (six) hours as needed. 07/01/23   [provider]  ranitidine 50 mg in sodium chloride  0.9 % 50 mL Take 150 mg by mouth. 09/24/12   [provider]  zolpidem (AMBIEN) 10 MG tablet Take 10 mg by mouth at bedtime as needed.    [provider]    Allergies: B12-c-des gastric sub-fe [chromagen], Morphine and codeine, and Vicodin [hydrocodone-acetaminophen ]    Review of Systems  Updated Vital Signs BP (!) 144/124   Pulse 92   Temp (!) 97.3 F (36.3 C) (Oral)   Resp 17   Wt 64 kg   SpO2 100%   BMI 25.81 kg/m   Physical Exam Vitals and nursing note reviewed.  HENT:     Head: Normocephalic and atraumatic.  Eyes:     Pupils: Pupils are equal, round, and reactive to light.  Pulmonary:     Effort: Pulmonary effort is normal. No respiratory distress.  Musculoskeletal:  General: No signs of injury.     Cervical back: Normal range of motion.  Skin:    General: Skin is dry.  Neurological:     Mental Status: She is alert.  Psychiatric:        Speech: Speech normal.        Behavior: Behavior normal.     (all labs ordered are listed, but only abnormal results are displayed) Labs Reviewed - No data to display  EKG: None  Radiology: No results found.   Procedures   Medications Ordered in the ED  amLODipine  (NORVASC ) tablet 10 mg (10 mg Oral Given 08/21/23 0542)  pantoprazole  (PROTONIX ) EC tablet 80 mg (80 mg Oral Given 08/21/23 0542)  ondansetron  (ZOFRAN -ODT) disintegrating tablet 4 mg (4 mg Oral Given 08/21/23 0542)                                     Medical Decision Making Risk Prescription drug management.   Patient presents to the emergency room requesting daily dose of Nexium , amlodipine , and Zofran .  Patient given Protonix , amlodipine , and Zofran .  The patient is hypertensive but has no complaints other than stating she has some mild nausea and requesting her daily Zofran .  She is not complaining of chest pain, urinary issues, headache.  I do not see the need to do lab work at this time.  There is no indication for emergent imaging at this time.  The patient did request a dose of both pain medication and Xanax  which I declined.  I told her to follow-up with her primary team for doses of those as needed.  The patient voiced understanding.  No indication for further emergent workup or admission.  Patient stable for discharge home.     Final diagnoses:  Encounter for medication refill    ED Discharge Orders     None          Kim Allison Kim Allison 08/21/23 0543    Kim Charmaine FALCON, MD 08/23/23 [phone]

## 2023-11-20 ENCOUNTER — Encounter (HOSPITAL_COMMUNITY): Payer: Self-pay

## 2023-11-20 ENCOUNTER — Other Ambulatory Visit: Payer: Self-pay

## 2023-11-20 ENCOUNTER — Emergency Department (HOSPITAL_COMMUNITY)
Admission: EM | Admit: 2023-11-20 | Discharge: 2023-11-20 | Disposition: A | Discharge status: Home / Self Care | Attending: Emergency Medicine | Admitting: Emergency Medicine

## 2023-11-20 DIAGNOSIS — G8929 Other chronic pain: Secondary | ICD-10-CM | POA: Diagnosis not present

## 2023-11-20 DIAGNOSIS — M549 Dorsalgia, unspecified: Secondary | ICD-10-CM | POA: Diagnosis present

## 2023-11-20 DIAGNOSIS — Z760 Encounter for issue of repeat prescription: Secondary | ICD-10-CM | POA: Insufficient documentation

## 2023-11-20 NOTE — Discharge Instructions (Signed)
 Follow up with your primary care provider to discuss pain management options.

## 2023-11-20 NOTE — ED Triage Notes (Signed)
 PT states that she has chronic back pain and that she is seeing a new pain doctor on the 11/1 or 11/2 and she is said she will be running out of pain medicine by Saturday.

## 2023-11-20 NOTE — ED Provider Notes (Signed)
 [place] EMERGENCY DEPARTMENT AT [facility] Provider Note   CSN: [id number] Arrival date & time: 11/20/23  9470     Patient presents with: Back Pain   Kim Allison is a 63 y.o. female.   63 year old female presents with request for refill of her percocet. Patient is changing pain management doctors, is almost out of her percocet. Seen by PCP on 11/07/23 and provided with #90 tablets of 5mg  oxycodone  for her back pain but states she takes 10mg  normally and is now almost out. Also requests Ambien. No new symptoms, no recent injuries.       Prior to Admission medications   Medication Sig Start Date End Date Taking? Authorizing Provider  ALPRAZolam  (XANAX ) 0.5 MG tablet Take 0.5 mg by mouth. 09/21/20   [provider]  amLODipine  (NORVASC ) 10 MG tablet Take 10 mg by mouth. 03/13/20   [provider]  atorvastatin (LIPITOR) 10 MG tablet Take 10 mg by mouth every morning. 08/01/23   [provider]  diclofenac (CATAFLAM) 50 MG tablet Take 50 mg by mouth. 12/24/14   [provider]  esomeprazole  (NEXIUM ) 40 MG capsule Take 1 capsule (40 mg total) by mouth daily. 08/14/23   Veta Palma, PA-C  hydrALAZINE (APRESOLINE) 50 MG tablet  11/04/20   [provider]  losartan (COZAAR) 25 MG tablet Take 25 mg by mouth.    [provider]  lurasidone (LATUDA) 20 MG TABS tablet Take 20 mg by mouth. 03/28/23   [provider]  metoprolol succinate (TOPROL-XL) 100 MG 24 hr tablet Take 100 mg by mouth.    [provider]  ondansetron  (ZOFRAN  ODT) 8 MG disintegrating tablet Take 1 tablet (8 mg total) by mouth every 8 (eight) hours as needed for nausea or vomiting. 11/10/13   Baxter Drivers, MD  oxyCODONE -acetaminophen  (PERCOCET) 10-325 MG tablet 1 tablet Orally every 6 hours, only if needed for severe pain for 28 days 02/15/20   [provider]  oxyCODONE -acetaminophen  (PERCOCET/ROXICET) 5-325 MG per tablet Take 1  tablet by mouth every 4 (four) hours as needed for severe pain. 11/10/13   Baxter Drivers, MD  pregabalin (LYRICA) 75 MG capsule Take 75 mg by mouth. 03/07/14   [provider]  promethazine-phenylephrine (PROMETHAZINE VC) 6.25-5 MG/5ML SYRP Take 5 mLs by mouth every 6 (six) hours as needed. 07/01/23   [provider]  ranitidine 50 mg in sodium chloride  0.9 % 50 mL Take 150 mg by mouth. 09/24/12   [provider]  zolpidem (AMBIEN) 10 MG tablet Take 10 mg by mouth at bedtime as needed.    [provider]    Allergies: B12-c-des gastric sub-fe [chromagen], Morphine and codeine, and Vicodin [hydrocodone-acetaminophen ]    Review of Systems Negative except as per HPI Updated Vital Signs BP (!) 191/121 (BP Location: Left Arm)   Pulse 94   Temp 97.7 F (36.5 C) (Oral)   Resp 18   SpO2 95%   Physical Exam Vitals and nursing note reviewed.  Constitutional:      General: She is not in acute distress.    Appearance: She is well-developed. She is not diaphoretic.  HENT:     Head: Normocephalic and atraumatic.  Cardiovascular:     Pulses: Normal pulses.  Pulmonary:     Effort: Pulmonary effort is normal.  Abdominal:     Palpations: Abdomen is soft.     Tenderness: There is no abdominal tenderness.  Musculoskeletal:  General: No tenderness.     Thoracic back: No tenderness or bony tenderness.     Lumbar back: No tenderness or bony tenderness.  Skin:    General: Skin is warm and dry.     Findings: No erythema or rash.  Neurological:     Mental Status: She is alert and oriented to person, place, and time.  Psychiatric:        Behavior: Behavior normal.     (all labs ordered are listed, but only abnormal results are displayed) Labs Reviewed - No data to display  EKG: None  Radiology: No results found.   Procedures   Medications Ordered in the ED - No data to display                                  Medical Decision Making  63 year  old female with request for refill of her percocet and ambien. Database reviewed, did fill #90 tablets of 5mg  oxycodone  on 11/07/23, prior to that did fill #120 tablets of 10mg /325 percocet.  Discussed policy with patient regarding narcotic pain medications/controlled substance. Unable to refill these in the ER and encouraged to discuss with her PCP today as it is a weekday (Thursday).  Offered non narcotic options, patient politely declines.      Final diagnoses:  Chronic back pain, unspecified back location, unspecified back pain laterality    ED Discharge Orders     None          Beverley Leita LABOR, PA-C 11/20/23 9443    Bari Charmaine FALCON, MD 11/21/23 [phone]
